# Patient Record
Sex: Female | Born: 2000 | Race: Black or African American | Hispanic: No | Marital: Single | Smoking: Never smoker
Health system: Southern US, Community
[De-identification: ages and names within clinical notes are randomized; demographics above are authoritative.]

## PROBLEM LIST (undated history)

## (undated) DIAGNOSIS — Q220 Pulmonary valve atresia: Secondary | ICD-10-CM

## (undated) DIAGNOSIS — J45909 Unspecified asthma, uncomplicated: Secondary | ICD-10-CM

## (undated) HISTORY — PX: CARDIAC SURGERY: SHX584

---

## 2020-07-27 ENCOUNTER — Emergency Department (HOSPITAL_COMMUNITY)
Admission: EM | Admit: 2020-07-27 | Discharge: 2020-07-27 | Disposition: A | Discharge status: Home / Self Care | Payer: Self-pay | Attending: Emergency Medicine | Admitting: Emergency Medicine

## 2020-07-27 ENCOUNTER — Encounter (HOSPITAL_COMMUNITY): Payer: Self-pay | Admitting: Emergency Medicine

## 2020-07-27 ENCOUNTER — Other Ambulatory Visit: Payer: Self-pay

## 2020-07-27 DIAGNOSIS — R1084 Generalized abdominal pain: Secondary | ICD-10-CM | POA: Insufficient documentation

## 2020-07-27 DIAGNOSIS — B9689 Other specified bacterial agents as the cause of diseases classified elsewhere: Secondary | ICD-10-CM | POA: Insufficient documentation

## 2020-07-27 DIAGNOSIS — R82998 Other abnormal findings in urine: Secondary | ICD-10-CM | POA: Insufficient documentation

## 2020-07-27 DIAGNOSIS — R944 Abnormal results of kidney function studies: Secondary | ICD-10-CM | POA: Insufficient documentation

## 2020-07-27 DIAGNOSIS — R112 Nausea with vomiting, unspecified: Secondary | ICD-10-CM | POA: Insufficient documentation

## 2020-07-27 HISTORY — DX: Pulmonary valve atresia: Q22.0

## 2020-07-27 LAB — URINALYSIS, ROUTINE W REFLEX MICROSCOPIC
Bilirubin Urine: NEGATIVE
Glucose, UA: NEGATIVE mg/dL
Hgb urine dipstick: NEGATIVE
Ketones, ur: NEGATIVE mg/dL
Leukocytes,Ua: NEGATIVE
Nitrite: POSITIVE — AB
Specific Gravity, Urine: 1.02 (ref 1.005–1.030)
pH: 8.5 — ABNORMAL HIGH (ref 5.0–8.0)

## 2020-07-27 LAB — COMPREHENSIVE METABOLIC PANEL
ALT: 22 U/L (ref 0–44)
AST: 38 U/L (ref 15–41)
Albumin: 5 g/dL (ref 3.5–5.0)
Alkaline Phosphatase: 77 U/L (ref 38–126)
Anion gap: 11 (ref 5–15)
BUN: 15 mg/dL (ref 6–20)
CO2: 20 mmol/L — ABNORMAL LOW (ref 22–32)
Calcium: 10 mg/dL (ref 8.9–10.3)
Chloride: 106 mmol/L (ref 98–111)
Creatinine, Ser: 1.01 mg/dL — ABNORMAL HIGH (ref 0.44–1.00)
GFR, Estimated: >60 mL/min (ref >60)
Glucose, Bld: 119 mg/dL — ABNORMAL HIGH (ref 70–99)
Potassium: 4.5 mmol/L (ref 3.5–5.1)
Sodium: 137 mmol/L (ref 135–145)
Total Bilirubin: 1.5 mg/dL — ABNORMAL HIGH (ref 0.3–1.2)
Total Protein: 8.3 g/dL — ABNORMAL HIGH (ref 6.5–8.1)

## 2020-07-27 LAB — CBC WITH DIFFERENTIAL/PLATELET
Abs Immature Granulocytes: 0.02 10*3/uL (ref 0.00–0.07)
Basophils Absolute: 0 10*3/uL (ref 0.0–0.1)
Basophils Relative: 0 %
Eosinophils Absolute: 0 10*3/uL (ref 0.0–0.5)
Eosinophils Relative: 0 %
HCT: 52.3 % — ABNORMAL HIGH (ref 36.0–46.0)
Hemoglobin: 16.9 g/dL — ABNORMAL HIGH (ref 12.0–15.0)
Immature Granulocytes: 0 %
Lymphocytes Relative: 17 %
Lymphs Abs: 1.2 10*3/uL (ref 0.7–4.0)
MCH: 28.6 pg (ref 26.0–34.0)
MCHC: 32.3 g/dL (ref 30.0–36.0)
MCV: 88.5 fL (ref 80.0–100.0)
Monocytes Absolute: 0.6 10*3/uL (ref 0.1–1.0)
Monocytes Relative: 9 %
Neutro Abs: 5 10*3/uL (ref 1.7–7.7)
Neutrophils Relative %: 74 %
Platelets: 202 10*3/uL (ref 150–400)
RBC: 5.91 MIL/uL — ABNORMAL HIGH (ref 3.87–5.11)
RDW: 13.3 % (ref 11.5–15.5)
WBC: 6.8 10*3/uL (ref 4.0–10.5)
nRBC: 0 % (ref 0.0–0.2)

## 2020-07-27 LAB — URINALYSIS, MICROSCOPIC (REFLEX): RBC / HPF: NONE SEEN RBC/hpf (ref 0–5)

## 2020-07-27 LAB — POC URINE PREG, ED: Preg Test, Ur: NEGATIVE

## 2020-07-27 LAB — PREGNANCY, URINE: Preg Test, Ur: NEGATIVE

## 2020-07-27 LAB — LIPASE, BLOOD: Lipase: 23 U/L (ref 11–51)

## 2020-07-27 MED ORDER — SUCRALFATE 1 G PO TABS: 1.0000 g | ORAL_TABLET | Freq: Three times a day (TID) | ORAL | 0 refills | Status: DC

## 2020-07-27 MED ORDER — PANTOPRAZOLE SODIUM 20 MG PO TBEC: 20.0000 mg | DELAYED_RELEASE_TABLET | Freq: Every day | ORAL | 0 refills | Status: DC

## 2020-07-27 MED ORDER — SUCRALFATE 1 G PO TABS
1.0000 g | ORAL_TABLET | Freq: Once | ORAL | Status: AC
  Administered 2020-07-27: 1 g via ORAL
  Filled 2020-07-27: qty 1

## 2020-07-27 MED ORDER — FAMOTIDINE IN NACL 20-0.9 MG/50ML-% IV SOLN
20.0000 mg | Freq: Once | INTRAVENOUS | Status: AC
  Administered 2020-07-27: 20 mg via INTRAVENOUS
  Filled 2020-07-27: qty 50

## 2020-07-27 MED ORDER — SODIUM CHLORIDE 0.9 % IV BOLUS
1000.0000 mL | Freq: Once | INTRAVENOUS | Status: AC
  Administered 2020-07-27: 1000 mL via INTRAVENOUS

## 2020-07-27 MED ORDER — FENTANYL CITRATE (PF) 100 MCG/2ML IJ SOLN
100.0000 ug | Freq: Once | INTRAMUSCULAR | Status: AC
Start: 2020-07-27 — End: 2020-07-27
  Administered 2020-07-27: 100 ug via INTRAVENOUS
  Filled 2020-07-27: qty 2

## 2020-07-27 MED ORDER — ONDANSETRON HCL 4 MG/2ML IJ SOLN
4.0000 mg | Freq: Once | INTRAMUSCULAR | Status: AC
  Administered 2020-07-27: 4 mg via INTRAVENOUS
  Filled 2020-07-27: qty 2

## 2020-07-27 NOTE — Discharge Instructions (Addendum)
You came to the [facility] today to be evaluated for your abdominal pain, nausea, and vomiting.  Your physical exam and lab work were reassuring.  I did give you prescription for Pantoprazole and Carafate.  Please take these medications as prescribed.  I have given you permission to follow-up with a local gastroenterologist.  Please call their office tomorrow to schedule an appointment.  Please also refrain from smoking any marijuana as this can cause nausea, vomiting, and abdominal pain.  Get help right away if: Your pain does not go away as soon as your health care provider told you to expect. You cannot stop vomiting. Your pain is only in areas of the abdomen, such as the right side or the left lower portion of the abdomen. Pain on the right side could be caused by appendicitis. You have bloody or black stools, or stools that look like tar. You have severe pain, cramping, or bloating in your abdomen. You have signs of dehydration, such as: Dark urine, very little urine, or no urine. Cracked lips. Dry mouth. Sunken eyes. Sleepiness. Weakness. You have trouble breathing or chest pain.

## 2020-07-27 NOTE — ED Triage Notes (Addendum)
Patient c/o abdominal pain today with N/V/D. Reports hx of ulcer and tapeworm. [place]. Patient acitvely vomiting in triage and removing blood pressure cuff prior to completion of blood pressure measurement.

## 2020-07-27 NOTE — ED Provider Notes (Signed)
[facility]-EMERGENCY DEPT Provider Note   CSN: [id number] Arrival date & time: 07/27/20  1717     History Chief Complaint  Patient presents with  . Abdominal Pain    Maureen Schultz is a 20 y.o. 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 pylori.  Reports that she was treated for H. pylori last month by a gastroenterologist in [place].  Patient has just recently moved to the area.  She does not take any medications on a regular basis.  Presents with chief complaint of upper quadrant abdominal pain.  Patient reports that paint started 0800 this morning.  Pain has been constant since then and progressively worsening.  Pain radiates throughout her entire abdomen.  Patient rates pain 10/10 on the pain scale.  Patient endorses associated nausea and vomiting.  Patient endorses vomiting more than 20 times since this morning.  Patient describes emesis as bilious.  Denies any coffee-ground emesis or bloody emesis.  Patient endorses diarrhea.  She tried Zofran and Pepcid at home with no relief of his symptoms.  Patient denies any fevers, chills, abdominal distention, blood in stool, melena, urinary symptoms, vaginal bleeding, vaginal pain, vaginal discharge.  LMP4/30.  Patient is sexually active in a mutually monogamous relationship with female partner.  Patient denies any birth control or condom use.  Patient denies ibuprofen use, and alcohol use.  Patient endorses marijuana use.  HPI     Past Medical History:  Diagnosis Date  . Pulmonary atresia     There are no problems to display for this patient.   History reviewed. No pertinent surgical history.   OB History   No obstetric history on file.     No family history on file.     Home Medications Prior to Admission medications   Not on File    Allergies    Patient has no known allergies.  Review of Systems   Review of Systems  Constitutional: Negative for chills and fever.   Eyes: Negative for visual disturbance.  Respiratory: Negative for shortness of breath.   Cardiovascular: Negative for chest pain.  Gastrointestinal: Positive for abdominal pain, diarrhea, nausea and vomiting. Negative for abdominal distention, anal bleeding, blood in stool, constipation and rectal pain.  Genitourinary: Negative for decreased urine volume, difficulty urinating, dysuria, flank pain, frequency, hematuria, pelvic pain, vaginal bleeding, vaginal discharge and vaginal pain.  Musculoskeletal: Negative for back pain and neck pain.  Skin: Negative for color change and rash.  Neurological: Negative for dizziness, syncope, light-headedness and headaches.  Psychiatric/Behavioral: Negative for confusion.    Physical Exam Updated Vital Signs BP 122/85 (BP Location: Left Arm)   Pulse 64   Resp (!) 24   LMP 07/23/2020   SpO2 93%   Physical Exam Vitals and nursing note reviewed.  Constitutional:      General: She is not in acute distress.    Appearance: She is not ill-appearing, toxic-appearing or diaphoretic.  HENT:     Head: Normocephalic.  Eyes:     General: No scleral icterus.       Right eye: No discharge.        Left eye: No discharge.  Cardiovascular:     Rate and Rhythm: Normal rate.  Pulmonary:     Effort: Pulmonary effort is normal. No respiratory distress.     Breath sounds: Normal breath sounds.  Abdominal:     General: Abdomen is flat. Bowel sounds are normal. There is no distension. There are no signs  of injury.     Palpations: Abdomen is soft. There is no mass or pulsatile mass.     Tenderness: There is generalized abdominal tenderness. There is no right CVA tenderness, left CVA tenderness, guarding or rebound.     Hernia: There is no hernia in the umbilical area or ventral area.     Comments: Piercing to umbilicus  Musculoskeletal:     Cervical back: Normal range of motion and neck supple.     Right lower leg: No swelling or tenderness. No edema.     Left  lower leg: No swelling or tenderness. No edema.  Skin:    General: Skin is warm and dry.     Coloration: Skin is not cyanotic, jaundiced or pale.  Neurological:     General: No focal deficit present.     Mental Status: She is alert.  Psychiatric:        Behavior: Behavior is cooperative.     ED Results / Procedures / Treatments   Labs (all labs ordered are listed, but only abnormal results are displayed) Labs Reviewed  COMPREHENSIVE METABOLIC PANEL - Abnormal; Notable for the following components:      Result Value   CO2 20 (*)    Glucose, Bld 119 (*)    Creatinine, Ser 1.01 (*)    Total Protein 8.3 (*)    Total Bilirubin 1.5 (*)    All other components within normal limits  URINALYSIS, ROUTINE W REFLEX MICROSCOPIC - Abnormal; Notable for the following components:   APPearance HAZY (*)    pH 8.5 (*)    Protein, ur TRACE (*)    Nitrite POSITIVE (*)    All other components within normal limits  CBC WITH DIFFERENTIAL/PLATELET - Abnormal; Notable for the following components:   RBC 5.91 (*)    Hemoglobin 16.9 (*)    HCT 52.3 (*)    All other components within normal limits  URINALYSIS, MICROSCOPIC (REFLEX) - Abnormal; Notable for the following components:   Bacteria, UA MANY (*)    All other components within normal limits  URINE CULTURE  LIPASE, BLOOD  PREGNANCY, URINE  I-STAT BETA HCG BLOOD, ED ([facility], [facility], [facility] ONLY)  POC URINE PREG, ED    EKG None  Radiology No results found.  Procedures Procedures   Medications Ordered in ED Medications  fentaNYL (SUBLIMAZE) injection 100 mcg (100 mcg Intravenous Given 07/27/20 1757)  ondansetron (ZOFRAN) injection 4 mg (4 mg Intravenous Given 07/27/20 1757)  sodium chloride 0.9 % bolus 1,000 mL (0 mLs Intravenous Stopped 07/27/20 1854)  famotidine (PEPCID) IVPB 20 mg premix (0 mg Intravenous Stopped 07/27/20 1959)  sucralfate (CARAFATE) tablet 1 g (1 g Oral Given 07/27/20 2235)    ED Course  I have reviewed the triage vital  signs and the nursing notes.  Pertinent labs & imaging results that were available during my care of the patient were reviewed by me and considered in my medical decision making (see chart for details).    MDM Rules/Calculators/A&P                          Upon initial assessment patient is actively dry heaving and writhing on stretcher.  Abdomen is soft, nondistended, generalized tenderness throughout abdomen.  No mass, pulsatile mass, guarding, rebound tenderness, or CVA tenderness.  Hemodynamically stable.  Will give patient IV Zofran and fentanyl.  Will reassess and obtain more history of present illness.  On repeat examination patient is  in no acute distress, no longer dry heaving.  Abdomen is soft, nondistended, nontender.  Will obtain pregnancy test, CBC, CMP, lipase, urinalysis.  Patient given 1 L fluid bolus and Pepcid.  CBC shows hemoconcentration likely secondary to dehydration from nausea and vomiting. CMP shows creatinine slightly elevated at 1.01, total bili slightly elevated at 1.5, and bicarb slightly decreased at 20. Lipase within normal limits, low suspicion for pancreatitis.  Patient is afebrile, no leukocytosis, no tenderness to right lower quadrant or rebound tenderness present.  Low suspicion for acute appendicitis.  Pregnancy test negative, low suspicion for intrauterine pregnancy or ectopic pregnancy.  Urinalysis shows leukocytes negative, nitrite positive, bacteria many, squamous epithelial 6-10, WBC 6-10, RBC none seen.  We will send urine for culture at this time.  Will defer any treatment as patient is asymptomatic and nonpregnant.  On serial repeat examination patient's abdomen remains soft, nondistended, nontender.  Patient has no further episodes of vomiting.  Patient able to tolerate p.o. challenge without difficulty.  Patient reports improvement in symptoms and feels ready for discharge.  Will discharge patient with prescription for pantoprazole and Carafate.   Patient reports that she has Zofran medication at home.  We will give patient information to follow-up with [facility] gastroenterology.Discussed results, findings, treatment and follow up. Patient advised of return precautions. Patient verbalized understanding and agreed with plan.   Final Clinical Impression(s) / ED Diagnoses Final diagnoses:  Generalized abdominal pain    Rx / DC Orders ED Discharge Orders         Ordered    pantoprazole (PROTONIX) 20 MG tablet  Daily        07/27/20 2216    sucralfate (CARAFATE) 1 g tablet  3 times daily with meals & bedtime        07/27/20 2216           Haskel Schroeder, PA-C 07/28/20 0044    Lorre Nick, MD 07/30/20 [phone]

## 2020-07-27 NOTE — ED Notes (Signed)
Call to lab to find out ETA of UA which was sent at 2015

## 2020-07-27 NOTE — ED Notes (Signed)
Call to lab to add culture on to urine in lab

## 2020-07-27 NOTE — ED Notes (Signed)
Pt appears in no distress.  Water and ginger ale given

## 2020-07-29 ENCOUNTER — Encounter (HOSPITAL_COMMUNITY): Payer: Self-pay

## 2020-07-29 ENCOUNTER — Other Ambulatory Visit: Payer: Self-pay

## 2020-07-29 ENCOUNTER — Emergency Department (HOSPITAL_COMMUNITY)
Admission: EM | Admit: 2020-07-29 | Discharge: 2020-07-29 | Disposition: A | Discharge status: Home / Self Care | Payer: Self-pay | Attending: Emergency Medicine | Admitting: Emergency Medicine

## 2020-07-29 DIAGNOSIS — R109 Unspecified abdominal pain: Secondary | ICD-10-CM

## 2020-07-29 DIAGNOSIS — R112 Nausea with vomiting, unspecified: Secondary | ICD-10-CM | POA: Insufficient documentation

## 2020-07-29 DIAGNOSIS — R197 Diarrhea, unspecified: Secondary | ICD-10-CM | POA: Insufficient documentation

## 2020-07-29 DIAGNOSIS — J45909 Unspecified asthma, uncomplicated: Secondary | ICD-10-CM | POA: Insufficient documentation

## 2020-07-29 DIAGNOSIS — R1084 Generalized abdominal pain: Secondary | ICD-10-CM | POA: Insufficient documentation

## 2020-07-29 HISTORY — DX: Unspecified asthma, uncomplicated: J45.909

## 2020-07-29 LAB — COMPREHENSIVE METABOLIC PANEL
ALT: 19 U/L (ref 0–44)
AST: 25 U/L (ref 15–41)
Albumin: 4.2 g/dL (ref 3.5–5.0)
Alkaline Phosphatase: 66 U/L (ref 38–126)
Anion gap: 8 (ref 5–15)
BUN: 13 mg/dL (ref 6–20)
CO2: 21 mmol/L — ABNORMAL LOW (ref 22–32)
Calcium: 9 mg/dL (ref 8.9–10.3)
Chloride: 107 mmol/L (ref 98–111)
Creatinine, Ser: 0.76 mg/dL (ref 0.44–1.00)
GFR, Estimated: >60 mL/min (ref >60)
Glucose, Bld: 82 mg/dL (ref 70–99)
Potassium: 3.8 mmol/L (ref 3.5–5.1)
Sodium: 136 mmol/L (ref 135–145)
Total Bilirubin: 1.2 mg/dL (ref 0.3–1.2)
Total Protein: 7 g/dL (ref 6.5–8.1)

## 2020-07-29 LAB — CBC
HCT: 46.3 % — ABNORMAL HIGH (ref 36.0–46.0)
Hemoglobin: 15.5 g/dL — ABNORMAL HIGH (ref 12.0–15.0)
MCH: 29.1 pg (ref 26.0–34.0)
MCHC: 33.5 g/dL (ref 30.0–36.0)
MCV: 86.9 fL (ref 80.0–100.0)
Platelets: 164 10*3/uL (ref 150–400)
RBC: 5.33 MIL/uL — ABNORMAL HIGH (ref 3.87–5.11)
RDW: 13.2 % (ref 11.5–15.5)
WBC: 4.4 10*3/uL (ref 4.0–10.5)
nRBC: 0 % (ref 0.0–0.2)

## 2020-07-29 LAB — LIPASE, BLOOD: Lipase: 23 U/L (ref 11–51)

## 2020-07-29 LAB — RAPID URINE DRUG SCREEN, HOSP PERFORMED
Amphetamines: NOT DETECTED
Barbiturates: NOT DETECTED
Benzodiazepines: NOT DETECTED
Cocaine: NOT DETECTED
Opiates: NOT DETECTED
Tetrahydrocannabinol: POSITIVE — AB

## 2020-07-29 LAB — URINALYSIS, ROUTINE W REFLEX MICROSCOPIC
Bilirubin Urine: NEGATIVE
Glucose, UA: NEGATIVE mg/dL
Hgb urine dipstick: NEGATIVE
Ketones, ur: 20 mg/dL — AB
Leukocytes,Ua: NEGATIVE
Nitrite: NEGATIVE
Protein, ur: NEGATIVE mg/dL
Specific Gravity, Urine: 1.006 (ref 1.005–1.030)
pH: 7 (ref 5.0–8.0)

## 2020-07-29 LAB — I-STAT BETA HCG BLOOD, ED (MC, WL, AP ONLY): I-stat hCG, quantitative: <5 m[IU]/mL (ref <5)

## 2020-07-29 MED ORDER — ALUM & MAG HYDROXIDE-SIMETH 200-200-20 MG/5ML PO SUSP
30.0000 mL | Freq: Once | ORAL | Status: AC
  Administered 2020-07-29: 30 mL via ORAL
  Filled 2020-07-29: qty 30

## 2020-07-29 MED ORDER — ONDANSETRON 4 MG PO TBDP: 4.0000 mg | ORAL_TABLET | Freq: Three times a day (TID) | ORAL | 0 refills | Status: DC | PRN

## 2020-07-29 MED ORDER — LIDOCAINE VISCOUS HCL 2 % MT SOLN
15.0000 mL | Freq: Once | OROMUCOSAL | Status: AC
  Administered 2020-07-29: 15 mL via ORAL
  Filled 2020-07-29: qty 15

## 2020-07-29 MED ORDER — ONDANSETRON HCL 4 MG/2ML IJ SOLN
4.0000 mg | Freq: Once | INTRAMUSCULAR | Status: AC
  Administered 2020-07-29: 4 mg via INTRAVENOUS
  Filled 2020-07-29: qty 2

## 2020-07-29 MED ORDER — PANTOPRAZOLE SODIUM 40 MG IV SOLR
40.0000 mg | Freq: Once | INTRAVENOUS | Status: AC
  Administered 2020-07-29: 40 mg via INTRAVENOUS
  Filled 2020-07-29: qty 40

## 2020-07-29 MED ORDER — LACTATED RINGERS IV BOLUS
1000.0000 mL | Freq: Once | INTRAVENOUS | Status: AC
  Administered 2020-07-29: 1000 mL via INTRAVENOUS

## 2020-07-29 NOTE — ED Triage Notes (Signed)
Patient c/o generalized abdominal pain, N/V/D x 1 hour.  Patient states she was seen 2 daysa go for the same symptoms.

## 2020-07-29 NOTE — Discharge Instructions (Signed)
Take the Zofran prescribed for nausea.  You can use over-the-counter Pepto-Bismol or Maalox for irritation as well as the proton pump inhibitor.  Continue to take all prescribed medications and follow-up with gastroenterologist provided.  Return to [place] with any concerning changes.  Avoid any irritating substances that we discussed

## 2020-07-29 NOTE — ED Provider Notes (Signed)
[facility]-EMERGENCY DEPT Provider Note   CSN: [id number] Arrival date & time: 07/29/20  1251     History Chief Complaint  Patient presents with  . Abdominal Pain  . Emesis  . Diarrhea    Maureen Schultz is a 20 y.o. female.   Abdominal Pain Pain location:  Generalized Pain quality: aching   Pain radiates to:  Does not radiate Pain severity:  Moderate Onset quality:  Gradual Timing:  Intermittent Progression:  Waxing and waning Chronicity:  Recurrent Context comment:  HX of PUD Relieved by: PPI. Worsened by:  Nothing Ineffective treatments:  None tried Associated symptoms: diarrhea, nausea and vomiting   Associated symptoms: no chest pain, no chills, no cough, no dysuria, no fever and no shortness of breath        Past Medical History:  Diagnosis Date  . Asthma   . Pulmonary atresia     There are no problems to display for this patient.   Past Surgical History:  Procedure Laterality Date  . CARDIAC SURGERY     x3     OB History   No obstetric history on file.     Family History  Family history unknown: Yes    Social History   Tobacco Use  . Smoking status: Never Smoker  . Smokeless tobacco: Never Used  Vaping Use  . Vaping Use: Never used  Substance Use Topics  . Alcohol use: Never  . Drug use: Yes    Types: Marijuana    Home Medications Prior to Admission medications   Medication Sig Start Date End Date Taking? Authorizing Provider  ondansetron (ZOFRAN ODT) 4 MG disintegrating tablet Take 1 tablet (4 mg total) by mouth every 8 (eight) hours as needed for up to 10 doses for nausea or vomiting. 07/29/20  Yes Sabino Donovan, MD  pantoprazole (PROTONIX) 20 MG tablet Take 1 tablet (20 mg total) by mouth daily. 07/27/20   Haskel Schroeder, PA-C  sucralfate (CARAFATE) 1 g tablet Take 1 tablet (1 g total) by mouth 4 (four) times daily -  with meals and at bedtime. 07/27/20 08/26/20  Haskel Schroeder, PA-C    Allergies     Other, Pineapple, and Raspberry  Review of Systems   Review of Systems  Constitutional: Negative for chills and fever.  HENT: Negative for congestion and rhinorrhea.   Respiratory: Negative for cough and shortness of breath.   Cardiovascular: Negative for chest pain and palpitations.  Gastrointestinal: Positive for abdominal pain, diarrhea, nausea and vomiting.  Genitourinary: Negative for difficulty urinating and dysuria.  Musculoskeletal: Negative for arthralgias and back pain.  Skin: Negative for rash and wound.  Neurological: Negative for light-headedness and headaches.    Physical Exam Updated Vital Signs BP 110/68   Pulse (!) 52   Temp (!) 97.4 F (36.3 C) (Oral)   Resp 18   Ht 5\' 3"  (1.6 m)   Wt 74.8 kg   LMP 07/23/2020   SpO2 92%   BMI 29.23 kg/m   Physical Exam Vitals and nursing note reviewed. Exam conducted with a chaperone present.  Constitutional:      General: She is not in acute distress.    Appearance: Normal appearance.  HENT:     Head: Normocephalic and atraumatic.     Nose: No rhinorrhea.  Eyes:     General:        Right eye: No discharge.        Left eye: No discharge.  Conjunctiva/sclera: Conjunctivae normal.  Cardiovascular:     Rate and Rhythm: Normal rate and regular rhythm.  Pulmonary:     Effort: Pulmonary effort is normal. No respiratory distress.     Breath sounds: No stridor.  Abdominal:     General: Abdomen is flat. There is no distension.     Palpations: Abdomen is soft.     Tenderness: There is generalized abdominal tenderness. There is no guarding or rebound. Negative signs include Murphy's sign, Rovsing's sign and McBurney's sign.  Musculoskeletal:        General: No tenderness or signs of injury.  Skin:    General: Skin is warm and dry.  Neurological:     General: No focal deficit present.     Mental Status: She is alert. Mental status is at baseline.     Motor: No weakness.  Psychiatric:        Mood and Affect: Mood  normal.        Behavior: Behavior normal.     ED Results / Procedures / Treatments   Labs (all labs ordered are listed, but only abnormal results are displayed) Labs Reviewed  COMPREHENSIVE METABOLIC PANEL - Abnormal; Notable for the following components:      Result Value   CO2 21 (*)    All other components within normal limits  CBC - Abnormal; Notable for the following components:   RBC 5.33 (*)    Hemoglobin 15.5 (*)    HCT 46.3 (*)    All other components within normal limits  URINALYSIS, ROUTINE W REFLEX MICROSCOPIC - Abnormal; Notable for the following components:   Color, Urine STRAW (*)    Ketones, ur 20 (*)    All other components within normal limits  RAPID URINE DRUG SCREEN, HOSP PERFORMED - Abnormal; Notable for the following components:   Tetrahydrocannabinol POSITIVE (*)    All other components within normal limits  LIPASE, BLOOD  I-STAT BETA HCG BLOOD, ED ([facility], [facility], [facility] ONLY)    EKG None  Radiology No results found.  Procedures Procedures   Medications Ordered in ED Medications  alum & mag hydroxide-simeth (MAALOX/MYLANTA) 200-200-20 MG/5ML suspension 30 mL (30 mLs Oral Given 07/29/20 1352)    And  lidocaine (XYLOCAINE) 2 % viscous mouth solution 15 mL (15 mLs Oral Given 07/29/20 1353)  ondansetron (ZOFRAN) injection 4 mg (4 mg Intravenous Given 07/29/20 1353)  lactated ringers bolus 1,000 mL (1,000 mLs Intravenous New Bag/Given 07/29/20 1353)  pantoprazole (PROTONIX) injection 40 mg (40 mg Intravenous Given 07/29/20 1443)    ED Course  I have reviewed the triage vital signs and the nursing notes.  Pertinent labs & imaging results that were available during my care of the patient were reviewed by me and considered in my medical decision making (see chart for details).    MDM Rules/Calculators/A&P                          Generalized abdominal pain history of peptic ulcer disease.  Feels that this is very similar.  Was seen a few days ago.  Had IV  fluids and PPI.  At that time was feeling much better.  Has gone home and had a good day with no symptoms yesterday and now has symptoms again nausea vomiting diarrhea.  Exam is nonfocal no signs of peritonitis she is overall well-appearing well-hydrated.  She will get IV fluids IV antiemetics GI cocktail viscous lidocaine Maalox and screening laboratory studies.  Laboratory studies are unremarkable except for THC.  This could be the cause of her nausea vomiting.  He may also have peptic ulcer disease as she claims.  She needs outpatient follow-up with gastroenterology this is provided.  After symptom control viscous lidocaine Maalox and PPI she is feeling much better.  She will be discharged home return precautions discussed.  Final Clinical Impression(s) / ED Diagnoses Final diagnoses:  Undifferentiated abdominal pain    Rx / DC Orders ED Discharge Orders         Ordered    ondansetron (ZOFRAN ODT) 4 MG disintegrating tablet  Every 8 hours PRN        07/29/20 1524           Sabino Donovan, MD 07/29/20 1526

## 2020-07-30 LAB — URINE CULTURE: Culture: >=100000 — AB

## 2020-07-31 ENCOUNTER — Telehealth: Payer: Self-pay | Admitting: Emergency Medicine

## 2020-07-31 NOTE — Progress Notes (Signed)
ED Antimicrobial Stewardship Positive Culture Follow Up   Maureen Schultz is an 20 y.o. female who presented to [facility] on 07/27/2020 with a chief complaint of  Chief Complaint  Patient presents with  . Abdominal Pain    Recent Results (from the past 720 hour(s))  Urine culture     Status: Abnormal   Collection Time: 07/27/20 10:13 PM   Specimen: Urine, Random  Result Value Ref Range Status   Specimen Description   Final    URINE, RANDOM Performed at [facility], [address] [place], [place] [address]    Special Requests   Final    NONE Performed at [facility], [address], [place], [place] [address]    Culture >=100,000 COLONIES/mL ESCHERICHIA COLI (A)  Final   Report Status 07/30/2020 FINAL  Final   Organism ID, Bacteria ESCHERICHIA COLI (A)  Final      Susceptibility   Escherichia coli - MIC*    AMPICILLIN >=32 RESISTANT Resistant     CEFAZOLIN <=4 SENSITIVE Sensitive     CEFEPIME <=0.12 SENSITIVE Sensitive     CEFTRIAXONE <=0.25 SENSITIVE Sensitive     CIPROFLOXACIN <=0.25 SENSITIVE Sensitive     GENTAMICIN <=1 SENSITIVE Sensitive     IMIPENEM <=0.25 SENSITIVE Sensitive     NITROFURANTOIN <=16 SENSITIVE Sensitive     TRIMETH/SULFA >=320 RESISTANT Resistant     AMPICILLIN/SULBACTAM >=32 RESISTANT Resistant     PIP/TAZO <=4 SENSITIVE Sensitive     * >=100,000 COLONIES/mL ESCHERICHIA COLI   Treatment plan is okay for now. Will call pt and recheck for urinary sx, if positive, will give Keflex 500mg  PO BID x 7 days.  ED Provider: , PA-C   Sharen Heck 07/31/2020, 10:14 AM Student Pharmacist

## 2020-07-31 NOTE — Telephone Encounter (Signed)
Post ED Visit - Positive Culture Follow-up: Successful Patient Follow-Up  Culture assessed and recommendations reviewed by:  []  , Pharm.D. []  Enzo Bi, Pharm.D., BCPS AQ-ID []  , Pharm.D., BCPS []  Celedonio Miyamoto, Pharm.D., BCPS []  [place], Garvin Fila.D., BCPS, AAHIVP []  , Pharm.D., BCPS, AAHIVP []  Georgina Pillion, PharmD, BCPS []  , PharmD, BCPS []  [place], PharmD, BCPS []  [place], PharmD  Positive urine culture  []  Patient discharged without antimicrobial prescription and treatment is now indicated []  Organism is resistant to prescribed ED discharge antimicrobial []  Patient with positive blood cultures  Changes discussed with ED provider: PA  New antibiotic prescription symptom check, if sx start keflex 500mg  po bid x 7 days  Contacted patient, no urinary symptoms, therefore no treatment needed   Estella Husk 07/31/2020, 11:16 AM

## 2020-12-18 ENCOUNTER — Emergency Department (HOSPITAL_COMMUNITY): Payer: Commercial Managed Care - PPO

## 2020-12-18 ENCOUNTER — Encounter (HOSPITAL_COMMUNITY): Payer: Self-pay | Admitting: *Deleted

## 2020-12-18 ENCOUNTER — Emergency Department (HOSPITAL_COMMUNITY)
Admission: EM | Admit: 2020-12-18 | Discharge: 2020-12-18 | Disposition: A | Discharge status: Home / Self Care | Payer: Commercial Managed Care - PPO | Attending: Emergency Medicine | Admitting: Emergency Medicine

## 2020-12-18 DIAGNOSIS — R112 Nausea with vomiting, unspecified: Secondary | ICD-10-CM | POA: Insufficient documentation

## 2020-12-18 DIAGNOSIS — R109 Unspecified abdominal pain: Secondary | ICD-10-CM | POA: Diagnosis present

## 2020-12-18 DIAGNOSIS — R1084 Generalized abdominal pain: Secondary | ICD-10-CM | POA: Diagnosis not present

## 2020-12-18 DIAGNOSIS — J45909 Unspecified asthma, uncomplicated: Secondary | ICD-10-CM | POA: Insufficient documentation

## 2020-12-18 DIAGNOSIS — R197 Diarrhea, unspecified: Secondary | ICD-10-CM | POA: Diagnosis not present

## 2020-12-18 LAB — COMPREHENSIVE METABOLIC PANEL
ALT: 25 U/L (ref 0–44)
AST: 38 U/L (ref 15–41)
Albumin: 4.4 g/dL (ref 3.5–5.0)
Alkaline Phosphatase: 67 U/L (ref 38–126)
Anion gap: 10 (ref 5–15)
BUN: 11 mg/dL (ref 6–20)
CO2: 18 mmol/L — ABNORMAL LOW (ref 22–32)
Calcium: 9.7 mg/dL (ref 8.9–10.3)
Chloride: 110 mmol/L (ref 98–111)
Creatinine, Ser: 0.91 mg/dL (ref 0.44–1.00)
GFR, Estimated: >60 mL/min (ref >60)
Glucose, Bld: 111 mg/dL — ABNORMAL HIGH (ref 70–99)
Potassium: 4.5 mmol/L (ref 3.5–5.1)
Sodium: 138 mmol/L (ref 135–145)
Total Bilirubin: 1.3 mg/dL — ABNORMAL HIGH (ref 0.3–1.2)
Total Protein: 7.4 g/dL (ref 6.5–8.1)

## 2020-12-18 LAB — CBC WITH DIFFERENTIAL/PLATELET
Abs Immature Granulocytes: 0.01 10*3/uL (ref 0.00–0.07)
Basophils Absolute: 0 10*3/uL (ref 0.0–0.1)
Basophils Relative: 0 %
Eosinophils Absolute: 0 10*3/uL (ref 0.0–0.5)
Eosinophils Relative: 0 %
HCT: 47.4 % — ABNORMAL HIGH (ref 36.0–46.0)
Hemoglobin: 15.9 g/dL — ABNORMAL HIGH (ref 12.0–15.0)
Immature Granulocytes: 0 %
Lymphocytes Relative: 12 %
Lymphs Abs: 0.6 10*3/uL — ABNORMAL LOW (ref 0.7–4.0)
MCH: 29.4 pg (ref 26.0–34.0)
MCHC: 33.5 g/dL (ref 30.0–36.0)
MCV: 87.6 fL (ref 80.0–100.0)
Monocytes Absolute: 0.4 10*3/uL (ref 0.1–1.0)
Monocytes Relative: 7 %
Neutro Abs: 4.1 10*3/uL (ref 1.7–7.7)
Neutrophils Relative %: 81 %
Platelets: 180 10*3/uL (ref 150–400)
RBC: 5.41 MIL/uL — ABNORMAL HIGH (ref 3.87–5.11)
RDW: 13.5 % (ref 11.5–15.5)
WBC: 5.1 10*3/uL (ref 4.0–10.5)
nRBC: 0 % (ref 0.0–0.2)

## 2020-12-18 LAB — URINALYSIS, ROUTINE W REFLEX MICROSCOPIC
Bilirubin Urine: NEGATIVE
Glucose, UA: NEGATIVE mg/dL
Ketones, ur: 20 mg/dL — AB
Leukocytes,Ua: NEGATIVE
Nitrite: NEGATIVE
Protein, ur: 30 mg/dL — AB
Specific Gravity, Urine: 1.017 (ref 1.005–1.030)
pH: 8 (ref 5.0–8.0)

## 2020-12-18 LAB — LIPASE, BLOOD: Lipase: 26 U/L (ref 11–51)

## 2020-12-18 LAB — POC URINE PREG, ED: Preg Test, Ur: NEGATIVE

## 2020-12-18 LAB — I-STAT BETA HCG BLOOD, ED (MC, WL, AP ONLY): I-stat hCG, quantitative: <5 m[IU]/mL (ref <5)

## 2020-12-18 MED ORDER — ONDANSETRON 4 MG PO TBDP: 4.0000 mg | ORAL_TABLET | Freq: Three times a day (TID) | ORAL | 0 refills | Status: DC | PRN

## 2020-12-18 MED ORDER — SODIUM CHLORIDE 0.9 % IV BOLUS
1000.0000 mL | Freq: Once | INTRAVENOUS | Status: AC
  Administered 2020-12-18: 1000 mL via INTRAVENOUS

## 2020-12-18 MED ORDER — ONDANSETRON HCL 4 MG/2ML IJ SOLN
4.0000 mg | Freq: Once | INTRAMUSCULAR | Status: AC
  Administered 2020-12-18: 4 mg via INTRAVENOUS
  Filled 2020-12-18: qty 2

## 2020-12-18 MED ORDER — ONDANSETRON 8 MG PO TBDP
8.0000 mg | ORAL_TABLET | Freq: Once | ORAL | Status: AC
  Administered 2020-12-18: 8 mg via ORAL
  Filled 2020-12-18: qty 1

## 2020-12-18 MED ORDER — HYDROMORPHONE HCL 1 MG/ML IJ SOLN
1.0000 mg | Freq: Once | INTRAMUSCULAR | Status: AC
  Administered 2020-12-18: 1 mg via INTRAVENOUS
  Filled 2020-12-18: qty 1

## 2020-12-18 MED ORDER — OMEPRAZOLE 40 MG PO CPDR: 40.0000 mg | DELAYED_RELEASE_CAPSULE | Freq: Two times a day (BID) | ORAL | 0 refills | Status: DC

## 2020-12-18 MED ORDER — HYDROCODONE-ACETAMINOPHEN 5-325 MG PO TABS
1.0000 | ORAL_TABLET | Freq: Once | ORAL | Status: AC
Start: 2020-12-18 — End: 2020-12-18
  Administered 2020-12-18: 1 via ORAL
  Filled 2020-12-18: qty 1

## 2020-12-18 NOTE — Discharge Instructions (Signed)
Take omeprazole twice daily as prescribed.  Take Zofran as needed as prescribed for nausea and vomiting.  Call GI to schedule appointment for follow-up.

## 2020-12-18 NOTE — ED Provider Notes (Signed)
[facility]-EMERGENCY DEPT Provider Note   CSN: [id number] Arrival date & time: 12/18/20  1004     History Chief Complaint  Patient presents with   Abdominal Pain    Maureen Schultz is a 20 y.o. female.  21 year old female brought in by EMS with abdominal pain onset last night. States she missed her last period, has never been pregnant, has not taken a home test. Also nausea, vomiting. Denies changes in bowel or bladder habits, vaginal discharge. Difficult to obtain  history as patient is screaming and vomiting.       Past Medical History:  Diagnosis Date   Asthma    Pulmonary atresia     There are no problems to display for this patient.   Past Surgical History:  Procedure Laterality Date   CARDIAC SURGERY     x3     OB History   No obstetric history on file.     Family History  Family history unknown: Yes    Social History   Tobacco Use   Smoking status: Never   Smokeless tobacco: Never  Vaping Use   Vaping Use: Never used  Substance Use Topics   Alcohol use: Never   Drug use: Yes    Types: Marijuana    Home Medications Prior to Admission medications   Medication Sig Start Date End Date Taking? Authorizing Provider  omeprazole (PRILOSEC) 40 MG capsule Take 1 capsule (40 mg total) by mouth 2 (two) times daily before a meal. 12/18/20 01/17/21 Yes Army Melia A, PA-C  ondansetron (ZOFRAN ODT) 4 MG disintegrating tablet Take 1 tablet (4 mg total) by mouth every 8 (eight) hours as needed for nausea or vomiting. 12/18/20  Yes Jeannie Fend, PA-C  sucralfate (CARAFATE) 1 g tablet Take 1 tablet (1 g total) by mouth 4 (four) times daily -  with meals and at bedtime. 07/27/20 08/26/20  Haskel Schroeder, PA-C    Allergies    Other, Pineapple, and Raspberry  Review of Systems   Review of Systems  Constitutional:  Negative for fever.  Respiratory:  Negative for shortness of breath.   Cardiovascular:  Negative for chest pain.   Gastrointestinal:  Positive for diarrhea, nausea and vomiting. Negative for abdominal pain and constipation.  Genitourinary:  Negative for dysuria.  Musculoskeletal:  Negative for arthralgias, back pain and myalgias.  Skin:  Negative for wound.  Allergic/Immunologic: Negative for immunocompromised state.  Neurological:  Negative for weakness.  All other systems reviewed and are negative.  Physical Exam Updated Vital Signs BP (!) 111/59   Pulse (!) 47   Temp 98.6 F (37 C)   Resp 16   SpO2 98%   Physical Exam Vitals and nursing note reviewed.  Constitutional:      Appearance: She is well-developed. She is not diaphoretic.     Comments: screaming  HENT:     Head: Normocephalic and atraumatic.  Cardiovascular:     Rate and Rhythm: Normal rate and regular rhythm.     Heart sounds: Normal heart sounds.  Pulmonary:     Effort: Pulmonary effort is normal.     Breath sounds: Normal breath sounds.  Abdominal:     Tenderness: There is generalized abdominal tenderness.  Skin:    General: Skin is warm and dry.     Findings: No erythema or rash.  Neurological:     Mental Status: She is alert and oriented to person, place, and time.  Psychiatric:  Behavior: Behavior normal.    ED Results / Procedures / Treatments   Labs (all labs ordered are listed, but only abnormal results are displayed) Labs Reviewed  CBC WITH DIFFERENTIAL/PLATELET - Abnormal; Notable for the following components:      Result Value   RBC 5.41 (*)    Hemoglobin 15.9 (*)    HCT 47.4 (*)    Lymphs Abs 0.6 (*)    All other components within normal limits  COMPREHENSIVE METABOLIC PANEL - Abnormal; Notable for the following components:   CO2 18 (*)    Glucose, Bld 111 (*)    Total Bilirubin 1.3 (*)    All other components within normal limits  URINALYSIS, ROUTINE W REFLEX MICROSCOPIC - Abnormal; Notable for the following components:   APPearance HAZY (*)    Hgb urine dipstick MODERATE (*)     Ketones, ur 20 (*)    Protein, ur 30 (*)    Bacteria, UA RARE (*)    All other components within normal limits  LIPASE, BLOOD  I-STAT BETA HCG BLOOD, ED ([facility], [facility], [facility] ONLY)  POC URINE PREG, ED    EKG None  Radiology US Transvaginal Non-OB  Result Date: 12/18/2020 CLINICAL DATA:  Pelvic pain. EXAM: TRANSABDOMINAL AND TRANSVAGINAL ULTRASOUND OF PELVIS DOPPLER ULTRASOUND OF OVARIES TECHNIQUE: Both transabdominal and transvaginal ultrasound examinations of the pelvis were performed. Transabdominal technique was performed for global imaging of the pelvis including uterus, ovaries, adnexal regions, and pelvic cul-de-sac. It was necessary to proceed with endovaginal exam following the transabdominal exam to visualize the uterus and ovaries. Color and duplex Doppler ultrasound was utilized to evaluate blood flow to the ovaries. COMPARISON:  None. FINDINGS: Uterus Measurements: 8.1 x 4.3 x 5.2 cm = volume: 96.5 mL. No fibroids or other mass visualized. Endometrium Thickness: 0.8 cm.  No focal abnormality visualized. Right ovary Measurements: 3.0 x 1.4 x 1.7 cm = volume: 3.9 mL. Normal appearance/no adnexal mass. Left ovary Measurements: 3.0 x 1.4 x 1.7 = volume: 3.9 mL. Normal appearance/no adnexal mass. Pulsed Doppler evaluation of both ovaries demonstrates normal low-resistance arterial and venous waveforms. Other findings Small to moderate amount of simple free fluid noted within the posterior cul-de-sac. IMPRESSION: Normal sonographic examination of the uterus and ovaries. Small-moderate amount of simple free fluid in the posterior cul-de-sac. Electronically Signed   By: Sherron Ales M.D.   On: 12/18/2020 13:20   US Pelvis Complete  Result Date: 12/18/2020 CLINICAL DATA:  Pelvic pain. EXAM: TRANSABDOMINAL AND TRANSVAGINAL ULTRASOUND OF PELVIS DOPPLER ULTRASOUND OF OVARIES TECHNIQUE: Both transabdominal and transvaginal ultrasound examinations of the pelvis were performed. Transabdominal technique was  performed for global imaging of the pelvis including uterus, ovaries, adnexal regions, and pelvic cul-de-sac. It was necessary to proceed with endovaginal exam following the transabdominal exam to visualize the uterus and ovaries. Color and duplex Doppler ultrasound was utilized to evaluate blood flow to the ovaries. COMPARISON:  None. FINDINGS: Uterus Measurements: 8.1 x 4.3 x 5.2 cm = volume: 96.5 mL. No fibroids or other mass visualized. Endometrium Thickness: 0.8 cm.  No focal abnormality visualized. Right ovary Measurements: 3.0 x 1.4 x 1.7 cm = volume: 3.9 mL. Normal appearance/no adnexal mass. Left ovary Measurements: 3.0 x 1.4 x 1.7 = volume: 3.9 mL. Normal appearance/no adnexal mass. Pulsed Doppler evaluation of both ovaries demonstrates normal low-resistance arterial and venous waveforms. Other findings Small to moderate amount of simple free fluid noted within the posterior cul-de-sac. IMPRESSION: Normal sonographic examination of the uterus and ovaries. Small-moderate  amount of simple free fluid in the posterior cul-de-sac. Electronically Signed   By: Sherron Ales M.D.   On: 12/18/2020 13:20   [place] Art/Ven Flow Abd Pelv Doppler  Result Date: 12/18/2020 CLINICAL DATA:  Pelvic pain. EXAM: TRANSABDOMINAL AND TRANSVAGINAL ULTRASOUND OF PELVIS DOPPLER ULTRASOUND OF OVARIES TECHNIQUE: Both transabdominal and transvaginal ultrasound examinations of the pelvis were performed. Transabdominal technique was performed for global imaging of the pelvis including uterus, ovaries, adnexal regions, and pelvic cul-de-sac. It was necessary to proceed with endovaginal exam following the transabdominal exam to visualize the uterus and ovaries. Color and duplex Doppler ultrasound was utilized to evaluate blood flow to the ovaries. COMPARISON:  None. FINDINGS: Uterus Measurements: 8.1 x 4.3 x 5.2 cm = volume: 96.5 mL. No fibroids or other mass visualized. Endometrium Thickness: 0.8 cm.  No focal abnormality visualized. Right  ovary Measurements: 3.0 x 1.4 x 1.7 cm = volume: 3.9 mL. Normal appearance/no adnexal mass. Left ovary Measurements: 3.0 x 1.4 x 1.7 = volume: 3.9 mL. Normal appearance/no adnexal mass. Pulsed Doppler evaluation of both ovaries demonstrates normal low-resistance arterial and venous waveforms. Other findings Small to moderate amount of simple free fluid noted within the posterior cul-de-sac. IMPRESSION: Normal sonographic examination of the uterus and ovaries. Small-moderate amount of simple free fluid in the posterior cul-de-sac. Electronically Signed   By: Sherron Ales M.D.   On: 12/18/2020 13:20    Procedures Procedures   Medications Ordered in ED Medications  ondansetron (ZOFRAN-ODT) disintegrating tablet 8 mg (8 mg Oral Given 12/18/20 1027)  HYDROcodone-acetaminophen (NORCO/VICODIN) 5-325 MG per tablet 1 tablet (1 tablet Oral Given 12/18/20 1115)  HYDROmorphone (DILAUDID) injection 1 mg (1 mg Intravenous Given 12/18/20 1211)  ondansetron (ZOFRAN) injection 4 mg (4 mg Intravenous Given 12/18/20 1212)  sodium chloride 0.9 % bolus 1,000 mL (1,000 mLs Intravenous New Bag/Given 12/18/20 1212)    ED Course  I have reviewed the triage vital signs and the nursing notes.  Pertinent labs & imaging results that were available during my care of the patient were reviewed by me and considered in my medical decision making (see chart for details).  Clinical Course as of 12/18/20 1409  Thu Dec 18, 2020  [id number] 20 year old female with severe pain as above. Concern for ectopic pregnancy vs torsion.  Upreg negative (unable to hold still for blood draw). [place] torsion study ordered with IV pain medications.  [LM]  1206 Chart review, seen earlier this year for undifferentiated abdominal pain, reports history of H. pylori.  Also marijuana positive, no specific diagnosis of cannabinoid emesis. [LM]  1309 Pain is controlled and patient is able to provide history now, pain woke her from her sleep at 4am, diffuse, sharp in  nature. Patient wen to the bathroom and had an episode of loose stools with vomiting.  Repeat exam, no significant tenderness at this time.  Pelvic US pending.  Labs overall reassuring including CBC, CMP.  Lipase normal.  hCG negative.  Urinalysis with ketones and protein, likely secondary to vomiting. [LM]  1408 Pain remains controlled, patient feeling better.  Ultrasound is negative for torsion.  Discussed results with patient who states this feels similar to her prior H. pylori infection.  States that she did not complete treatment when she was last diagnosed and has been unable to find a GI who does endoscopy. Patient be prescribed omeprazole, Zofran, referred to GI for follow-up after discharge today. [LM]    Clinical Course User Index [LM] Jeannie Fend, PA-C  MDM Rules/Calculators/A&P                           Final Clinical Impression(s) / ED Diagnoses Final diagnoses:  Generalized abdominal pain  Nausea vomiting and diarrhea    Rx / DC Orders ED Discharge Orders          Ordered    omeprazole (PRILOSEC) 40 MG capsule  2 times daily before meals        12/18/20 1406    ondansetron (ZOFRAN ODT) 4 MG disintegrating tablet  Every 8 hours PRN        12/18/20 1407             Jeannie Fend, PA-C 12/18/20 1409    Terrilee Files, MD 12/18/20 [phone]

## 2020-12-18 NOTE — ED Provider Notes (Signed)
Emergency Medicine Provider Triage Evaluation Note  Maureen Schultz , a 20 y.o. female  was evaluated in triage.  Pt complains of severe lower abdominal pain onset last night with nausea and vomiting. LMP 2 months ago, no home pregnancy test, denies bleeding or dc.   Review of Systems  Positive: Pelvic pain, vomiting Negative: Changes in bowel or bladder habits  Physical Exam  BP 108/66   Pulse 77   Temp 98.6 F (37 C)   Resp 20   SpO2 98%  Gen:   Awake, screaming in triage, active vomiting  Resp:  Normal effort  MSK:   Moves extremities without difficulty  Other:  Unable to assess abdominal pain secondary to pain   Medical Decision Making  Medically screening exam initiated at 10:14 AM.  Appropriate orders placed.  AJGOTL Solberg was informed that the remainder of the evaluation will be completed by another provider, this initial triage assessment does not replace that evaluation, and the importance of remaining in the ED until their evaluation is complete.     Jeannie Fend, PA-C 12/18/20 1021    Terrilee Files, MD 12/18/20 [phone]

## 2020-12-18 NOTE — ED Notes (Signed)
X2 uncessessful blood draw attempts by this Clinical research associate and Kendal Hymen EMT

## 2020-12-18 NOTE — ED Triage Notes (Signed)
Per EMS, pt complains abd pain, n/v since 5 am this morning. Hx of gastric ulcer. Reports some diarrhea. Missed her period last month. Takes Prilosec.

## 2020-12-18 NOTE — ED Notes (Signed)
Attempted to draw labs, pt vomiting and moving around stating she can't do this.

## 2021-03-23 NOTE — Progress Notes (Signed)
Date:  03/24/2021   ID:  Ellyce, Lafevers 03/19/2000, MRN [id number]  PCP:  Shanon Rosser, PA-C  Cardiologist:  Rex Kras, DO, [facility]  (established care 03/24/2021) Former Cardiology Providers: Dr. Daryl Eastern  and Dr. August Saucer   REASON FOR CONSULT: Atresia of pulmonary artery  REQUESTING PHYSICIAN:  Shanon Rosser, PA-C [address] [place],  [place] [id number]  Chief Complaint  Patient presents with   New Patient (Initial Visit)   Chest Pain   Shortness of Breath   Atresia of pulmonary artery    HPI  Makinzi Brymer is a 21 y.o. African-American female who presents to the office with a chief complaint of " history of pulmonary atresia status postrepair, chest pain and shortness of breath." Patient's past medical history and cardiovascular risk factors include: History of pulmonary atresia status post Blalock-Taussig shunt, bidirectional shunt, Fontan's surgery, asthma, marijuana use.  She is referred to the office at the request of Long, Nicki Reaper, PA-C for evaluation of atresia of pulmonary artery.  Patient is accompanied by her mother over the phone during today's office visit.  Patient provides verbal consent with regards to discussing her information in the presence of her mom.  Patient is here to establish care with cardiology given her extensive history despite her young age.  According to her mom while she was pregnant she was informed that 5 months into pregnancy that the fetus had congenital heart disease.  After the birth of her daughter patient underwent Blalock-Taussig shunt on day 3, at 79-mont[age]his was changed to bidirectional shunt, and at the age of 3.5 years underwent Fontan surgery.  Since then she has been followed up with pediatric cardiologist Dr. WDaryl Easternand Dr. FToney Rakes  [facility] [place] [facility] [place] [facility].  Patient states that she used to follow-up every 6 months with her pediatric cardi[place]  Recently she has been experiencing chest pain and shortness of breath.  Work-up recently noted influenza A infection which is resolved with treatment of Tamiflu.  Her baseline oxygen saturation is between 89-92% according to the patient.  Chest pain: Present for the last several years, occurs few times a month, randomly, located substernally, 6 out of 10 in intensity, more noticeable with over exertional activities, when she has difficulty in breathing, or has asthma flareup.  The symptoms are usually self-limited, does not resolve with rest.  Denies any near-syncope or syncopal event or heart failure.  No family history of premature coronary disease or sudden cardiac death.  FUNCTIONAL STATUS: No structured exercise program or daily routine.   ALLERGIES: Allergies  Allergen Reactions   Other     Peanuts and soy   Pineapple    Raspberry     MEDICATION LIST PRIOR TO VISIT: Current Meds  Medication Sig   albuterol (PROVENTIL) (5 MG/ML) 0.5% nebulizer solution albuterol sulfate concentrate 2.5 mg/0.5 mL solution for nebulization  Inhale 0.5 mL as needed by nebulization route for 1 day.     PAST MEDICAL HISTORY: Past Medical History:  Diagnosis Date   Asthma    Pulmonary atresia     PAST SURGICAL HISTORY: Past Surgical History:  Procedure Laterality Date   CARDIAC SURGERY     x3    FAMILY HISTORY: The patient Family history is unknown by patient.  SOCIAL HISTORY:  The patient  reports that she has never smoked. She has never used smokeless tobacco. She reports current alcohol use.  She reports current drug use. Drug: Marijuana.  REVIEW OF SYSTEMS: Review of Systems  Constitutional: Negative for chills and fever.  HENT:  Negative for hoarse voice and nosebleeds.   Eyes:  Negative for discharge, double vision and pain.  Cardiovascular:  Positive for chest pain, orthopnea (Chronic and stable) and paroxysmal nocturnal dyspnea (chronic and stable.). Negative for  claudication, dyspnea on exertion, leg swelling, near-syncope, palpitations and syncope.       Feet cooler than hands.   Respiratory:  Negative for hemoptysis and shortness of breath.   Musculoskeletal:  Negative for muscle cramps and myalgias.  Gastrointestinal:  Negative for abdominal pain, constipation, diarrhea, hematemesis, hematochezia, melena, nausea and vomiting.  Neurological:  Positive for headaches. Negative for dizziness and light-headedness.   PHYSICAL EXAM: Vitals with BMI 03/24/2021 12/18/2020 12/18/2020  Height _0  - -  Weight 121 lbs 3 oz - -  BMI 09.29 - -  Systolic 574 734 -  Diastolic 70 59 -  Pulse 53 47 50    CONSTITUTIONAL: Well-developed and well-nourished. No acute distress.  SKIN: Skin is warm and dry. No rash noted. No cyanosis. No pallor. No jaundice HEAD: Normocephalic and atraumatic.  EYES: No scleral icterus MOUTH/THROAT: Moist oral membranes.  NECK: No JVD present. No thyromegaly noted. No carotid bruits  LYMPHATIC: No visible cervical adenopathy.  CHEST Normal respiratory effort. No intercostal retractions.  Prior sternotomy scar is well-healed. LUNGS: Clear to auscultation bilaterally.  No stridor. No wheezes. No rales.  CARDIOVASCULAR: Regular rate and rhythm, positive S1-S2, no murmurs rubs or gallops appreciated. ABDOMINAL: Nonobese, soft, nontender, nondistended, positive bowel sounds in all 4 quadrants, no apparent ascites.  EXTREMITIES: No peripheral edema, warm to touch, bilateral faint PT pulses, 2+ bilateral DP pulses.   HEMATOLOGIC: No significant bruising NEUROLOGIC: Oriented to person, place, and time. Nonfocal. Normal muscle tone.  PSYCHIATRIC: Normal mood and affect. Normal behavior. Cooperative  CARDIAC DATABASE: EKG: 03/24/2021: Sinus bradycardia, 56 bpm, low voltage, LAE, left axis, consider inferior infarct, diffuse T wave inversion.  Echocardiogram: No results found for this or any previous visit from the past 1095 days.     Stress Testing: No results found for this or any previous visit from the past 1095 days.   Heart Catheterization: None  LABORATORY DATA: CBC Latest Ref Rng & Units 12/18/2020 07/29/2020 07/27/2020  WBC 4.0 - 10.5 K/uL 5.1 4.4 6.8  Hemoglobin 12.0 - 15.0 g/dL 15.9(H) 15.5(H) 16.9(H)  Hematocrit 36.0 - 46.0 % 47.4(H) 46.3(H) 52.3(H)  Platelets 150 - 400 K/uL 180 164 202    CMP Latest Ref Rng & Units 12/18/2020 07/29/2020 07/27/2020  Glucose 70 - 99 mg/dL 111(H) 82 119(H)  BUN 6 - 20 mg/dL _1 Creatinine 0.44 - 1.00 mg/dL 0.91 0.76 1.01(H)  Sodium 135 - 145 mmol/L 138 136 137  Potassium 3.5 - 5.1 mmol/L 4.5 3.8 4.5  Chloride 98 - 111 mmol/L 110 107 106  CO2 22 - 32 mmol/L 18(L) 21(L) 20(L)  Calcium 8.9 - 10.3 mg/dL 9.7 9.0 10.0  Total Protein 6.5 - 8.1 g/dL 7.4 7.0 8.3(H)  Total Bilirubin 0.3 - 1.2 mg/dL 1.3(H) 1.2 1.5(H)  Alkaline Phos 38 - 126 U/L 67 66 77  AST 15 - 41 U/L 38 25 38  ALT 0 - 44 U/L _2 Lipid Panel  No results found for: CHOL, TRIG, HDL, CHOLHDL, VLDL, LDLCALC, LDLDIRECT, LABVLDL  No components found for: NTPROBNP No results for input(s): PROBNP in the last  8760 hours. No results for input(s): TSH in the last 8760 hours.  BMP Recent Labs    07/27/20 1731 07/29/20 1312 12/18/20 1205  NA 137 136 138  K 4.5 3.8 4.5  CL 106 107 110  CO2 20* 21* 18*  GLUCOSE 119* 82 111*  BUN _0 CREATININE 1.01* 0.76 0.91  CALCIUM 10.0 9.0 9.7  GFRNONAA >60 >60 >60    HEMOGLOBIN A1C No results found for: HGBA1C, MPG  IMPRESSION:  External Labs:  Date Collected: 02/28/2021 , information obtained by [facility]  Potassium: 4.1 BUN 11, creatinine 0.94 mg/dL. AST 24, ALT 29, alkaline phosphatase 63 eGFR: 88 mL/min per 1.73 m Hemoglobin: 17.6 g/dL and hematocrit: 52.3 % TSH: 1.60      ICD-10-CM   1. Atresia of pulmonary artery  Q25.5 EKG 12-Lead    Ambulatory referral to Cardiology    2. Precordial pain  R07.2     3. Shortness of breath   R06.02        RECOMMENDATIONS: Satine Bewick is a 21 y.o. African-American female whose past medical history and cardiac risk factors include: History of pulmonary atresia status post Blalock-Taussig shunt, bidirectional shunt, Fontan's surgery, asthma, marijuana use.  Patient presents today to establish care as she recently moved from [place] to [place].  It appears the patient has had an extensive history of congenital heart disease and now is in need of an adult congenital specialist within the community.  According to the patient's mother patient has undergone Blalock-Taussig shunt, bidirectional shunt, followed by Fontan surgery.  She is overall relatively stable, euvolemic, and not in congestive heart failure.  She has been experiencing chest pain that is been present for the last several years and the overall intensity, frequency, and duration has not changed.  The underlying rhythm is sinus but she does have diffuse T wave inversions.  I do not have any prior EKGs for comparison to see if these are new findings versus old.  She recently has noted improvement in both the chest pain and shortness of breath after being treated for influenza infection last month.  I have also asked her to stop smoking marijuana but given her cardiac history.  I will refer her to adult congenital specialist Dr. Jeralyn Bennett to establish care given her history.  I spoke to both the patient and mother at length that with regards to longitudinal care from cardiovascular standpoint it would be in her best interest to follow-up with adult congenital cardiology.  I will request records from her former cardiologist Dr. Rudi Rummage in [place] and will if and when records are available uploaded in [facility] for reestablish continuity of care.  For now I will hold off on ordering additional testing.  But she would benefit from repeat echocardiogram, possible cardiac MRI, and ischemic  work-up.  In the meantime, if her symptoms of chest pain or shortness of breath become progressive she is asked to call the office for reevaluation and if the office is unavailable/closed she is encouraged to go to the closest ER via EMS for further evaluation and management.  Both the patient and mother verbalized understanding.  FINAL MEDICATION LIST END OF ENCOUNTER: No orders of the defined types were placed in this encounter.   Medications Discontinued During This Encounter  Medication Reason   omeprazole (PRILOSEC) 40 MG capsule    ondansetron (ZOFRAN ODT) 4 MG disintegrating tablet    sucralfate (CARAFATE) 1 g tablet  Current Outpatient Medications:    albuterol (PROVENTIL) (5 MG/ML) 0.5% nebulizer solution, albuterol sulfate concentrate 2.5 mg/0.5 mL solution for nebulization  Inhale 0.5 mL as needed by nebulization route for 1 day., Disp: , Rfl:   Orders Placed This Encounter  Procedures   Ambulatory referral to Cardiology   EKG 12-Lead    There are no Patient Instructions on file for this visit.   --Continue cardiac medications as reconciled in final medication list.  In people who has had prior history of prior surgeries --Return in about 3 months (around 06/22/2021) for Follow up history of pulmonary atresia.. Or sooner if needed. --Continue follow-up with your primary care physician regarding the management of your other chronic comorbid conditions.  Patient's questions and concerns were addressed to her satisfaction. She voices understanding of the instructions provided during this encounter.   This note was created using a voice recognition software as a result there may be grammatical errors inadvertently enclosed that do not reflect the nature of this encounter. Every attempt is made to correct such errors.  Rex Kras, [place], [facility]  Pager: [phone] Office: [phone]

## 2021-03-24 ENCOUNTER — Other Ambulatory Visit: Payer: Self-pay

## 2021-03-24 ENCOUNTER — Ambulatory Visit: Payer: Commercial Managed Care - PPO | Admitting: Cardiology

## 2021-03-24 ENCOUNTER — Encounter: Payer: Self-pay | Admitting: Cardiology

## 2021-03-24 VITALS — BP 118/70 | HR 53 | Temp 97.9°F | Resp 16 | Ht 63.0 in | Wt 121.2 lb

## 2021-03-24 DIAGNOSIS — Q255 Atresia of pulmonary artery: Secondary | ICD-10-CM

## 2021-03-24 DIAGNOSIS — R072 Precordial pain: Secondary | ICD-10-CM

## 2021-03-24 DIAGNOSIS — R0602 Shortness of breath: Secondary | ICD-10-CM

## 2021-03-26 ENCOUNTER — Institutional Professional Consult (permissible substitution): Payer: Commercial Managed Care - PPO | Admitting: Internal Medicine

## 2021-03-26 ENCOUNTER — Ambulatory Visit: Payer: Commercial Managed Care - PPO | Admitting: Internal Medicine

## 2021-03-26 ENCOUNTER — Other Ambulatory Visit: Payer: Self-pay

## 2021-03-26 ENCOUNTER — Ambulatory Visit (INDEPENDENT_AMBULATORY_CARE_PROVIDER_SITE_OTHER): Payer: Commercial Managed Care - PPO

## 2021-03-26 ENCOUNTER — Encounter: Payer: Self-pay | Admitting: Internal Medicine

## 2021-03-26 VITALS — BP 106/70 | HR 81 | Temp 97.6°F | Ht 62.0 in | Wt 120.6 lb

## 2021-03-26 DIAGNOSIS — Q220 Pulmonary valve atresia: Secondary | ICD-10-CM

## 2021-03-26 DIAGNOSIS — Z8709 Personal history of other diseases of the respiratory system: Secondary | ICD-10-CM

## 2021-03-26 LAB — CBC WITH DIFFERENTIAL/PLATELET
Basophils Absolute: 0 10*3/uL (ref 0.0–0.1)
Basophils Relative: 0.3 % (ref 0.0–3.0)
Eosinophils Absolute: 0.1 10*3/uL (ref 0.0–0.7)
Eosinophils Relative: 1.2 % (ref 0.0–5.0)
HCT: 46.3 % — ABNORMAL HIGH (ref 36.0–46.0)
Hemoglobin: 14.7 g/dL (ref 12.0–15.0)
Lymphocytes Relative: 29.1 % (ref 12.0–46.0)
Lymphs Abs: 1.7 10*3/uL (ref 0.7–4.0)
MCHC: 31.7 g/dL (ref 30.0–36.0)
MCV: 88.6 fl (ref 78.0–100.0)
Monocytes Absolute: 0.8 10*3/uL (ref 0.1–1.0)
Monocytes Relative: 14.7 % — ABNORMAL HIGH (ref 3.0–12.0)
Neutro Abs: 3.1 10*3/uL (ref 1.4–7.7)
Neutrophils Relative %: 54.7 % (ref 43.0–77.0)
Platelets: 257 10*3/uL (ref 150.0–400.0)
RBC: 5.23 Mil/uL — ABNORMAL HIGH (ref 3.87–5.11)
RDW: 14.6 % (ref 11.5–15.5)
WBC: 5.8 10*3/uL (ref 4.0–10.5)

## 2021-03-26 NOTE — Progress Notes (Signed)
OV 03/26/2021  Subjective:  Patient ID: Maureen Schultz, female , DOB: 03-04-2001 , age 21 y.o. , MRN: [id number] , ADDRESS: [address] 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 Pt states she recently was diagnosed with flu and pna and states that after having a recent cxr, she was told that her lungs are still filled with fluid. Pt states that she does have complaints of chest discomfort and an occasional cough.     HPI Maureen Schultz 21 y.o. -new consult evaluation in the pulmonary clinic.  She presents by herself but her mom is on the phone listening to the conversation.  Patient was born and raised in [place].  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  She was born with pulmonary atresia and at [facility] in [place] status post Blalock-Taussig shunt [bidirectional shunt) and Fontan surgery done for tricuspid atresia.  She recollects a total of 3 open heart surgeries.  She says she on a baseline has continues on and off chest pain and shortness of breath with exertion.  Somewhere along the way she has been given a diagnosis of asthma not otherwise specified.  She uses albuterol for rescue.  She is not on any maintenance inhalers.  Is unclear based on severity of asthma.  After relocating to [facility] in the spring 2022 she does not have a cardiologist but has primary care.  She believes at baseline because of her surgeries [her mom reported that she had a VSD created] her pulse ox runs around 86 to 88% at baseline.  She  says she is never been on oxygen other than when she is in the hospital.   With the above usual state of health early December 2020 for which she ended up getting influenza.  She went to primary care and apparently chest x-ray showed "fluid in the lungs".  I do not have the images with me.  She says she was treated with 2 course of antibiotics and then mid December 2022 had another chest x-ray that still showed fluid in the lungs.  She says she is never known to have "fluid in the lungs".  Her last hospitalization was in November 2021 in [place] for stomach issues.  She says at that time chest x-ray did not show any fluid.  So she believes the fluid problem is new.  She says is since the influenza she is better but she still has ongoing shortness of breath and the chest tightness.  She believes it is worse than her baseline.  But there is no nocturnal symptoms.  She saw Dr. Verneda Skill on 03/24/2021 of [place] [facility].  He is referred her to [facility] cardiology congenital clinic.  She has an appointment today.   U tox - may 2022 - THC +     Her walking desaturation test shows a tendency to desaturate.  It seems the finger probe is worse than the forehead probe.  Simple office walk 185 feet x  3 laps goal with forehead  probe 03/26/2021    O2 used Ra -initially finger pulse ox showed 86% room air at rest but with a forehead probe this was 93%   Number laps completed 3   Comments about pace Nl pace   Resting Pulse Ox/HR 93% and 80/min   Final Pulse Ox/HR 89% and 92/min   Desaturated </= 88% no   Desaturated <= 3% points yes   Got Tachycardic >/= 90/min yes   Symptoms at end of test Mild dyspnea   Miscellaneous comments x       No flowsheet data found.     CT Chest data  No results found.    PFT  No flowsheet data found.     has a past medical history of Asthma and Pulmonary atresia.   reports that she has never smoked. She has never used smokeless  tobacco.  Past Surgical History:  Procedure Laterality Date   CARDIAC SURGERY     x3    Allergies  Allergen Reactions   Other     Peanuts and soy   Pineapple    Raspberry     Immunization History  Administered Date(s) Administered   PFIZER(Purple Top)SARS-COV-2 Vaccination 11/19/2019, 12/10/2019    Family History  Family history unknown: Yes     Current Outpatient Medications:    albuterol (PROVENTIL) (5 MG/ML) 0.5% nebulizer solution, albuterol sulfate concentrate 2.5 mg/0.5 mL solution for nebulization  Inhale 0.5 mL as needed by nebulization route for 1 day., Disp: , Rfl:       Objective:   Vitals:   03/26/21 1129 03/26/21 1133  BP:  106/70  Pulse: 85 81  Temp:  97.6 F (36.4 C)  TempSrc:  Oral  SpO2: (!) 86% 93%  Weight:  120 lb 9.6 oz (54.7 kg)  Height:  5\' 2"  (1.575 m)    Estimated body mass index is 22.06 kg/m as calculated from the following:   Height as of this encounter: 5\' 2"  (1.575 m).   Weight as of this encounter: 120 lb 9.6 oz (54.7 kg).  @WEIGHTCHANGE @  [employer]   03/26/21 1133  Weight: 120 lb 9.6 oz (54.7 kg)     Physical Exam  General: No distress.  Thin and tall well-built.  No distress Neuro: Alert and Oriented x 3. GCS 15. Speech normal Psych: Pleasant Resp:  Barrel Chest - no.  Wheeze - no, Crackles - no, No overt respiratory distress CVS: Normal heart sounds. Murmurs - no Ext: Stigmata of Connective Tissue Disease - no HEENT: Normal upper airway. PEERL +. No post nasal drip        Assessment:       ICD-10-CM   1. History of influenza  Z87.09     2. History of asthma  Z87.09     3. Pulmonary atresia  Q22.0      With a forehead probe her hypoxemia is not as pronounced as with the finger probe.  In any event hypoxemia is likely to be because of shunt.  At this point in time we need to investigate the history of abnormal chest x-ray in the context of asthma and also her cardiac issues.  Her cardiac issues are  complex and [facility]  We will wait to see the data on this.  Meanwhile we will look at the history of asthma and a recent abnormal chest x-ray with "fluid in the lungs".  Therefore get a chest x-ray, CBC with differential blood RAST allergy profile  and blood IgE.  We will also get a full pulmonary function test with nitric oxide testing.  After that we will regroup   We will have to talk to her about quitting marijuana at some point    Plan:     Patient Instructions     ICD-10-CM   1. History of influenza  Z87.09     2. History of asthma  Z87.09     3. Pulmonary atresia  Q22.0       Plan  - Do chest x-ray two-view - Do CBC with differential and RAST allergy profile and blood IgE -Do full pulmonary function test -Do breathing test: Exhaled nitric oxide -Keep appointment with [facility] cardiology today  Follow-up - Return to see Dr. Marchelle Gearing or nurse practitioner in the next 4 weeks to discuss test results    SIGNATURE    Dr. Kalman Shan, M.D., F.C.C.P,  Pulmonary and Critical Care Medicine Staff Physician, [facility] Director - Interstitial Lung Disease  Program  Pulmonary Fibrosis [facility] [place], [place], [address]  Pager: [phone], If no answer or between  15:00h - 7:00h: call [phone] Telephone: [phone]  12:12 PM 03/26/2021

## 2021-03-26 NOTE — Patient Instructions (Addendum)
ICD-10-CM   1. History of influenza  Z87.09     2. History of asthma  Z87.09     3. Pulmonary atresia  Q22.0       Plan  - Do chest x-ray two-view - Do CBC with differential and RAST allergy profile and blood IgE -Do full pulmonary function test -Do breathing test: Exhaled nitric oxide -Keep appointment with [facility] cardiology today  Follow-up - Return to see Dr. Marchelle Gearing or nurse practitioner in the next 4 weeks to discuss test results

## 2021-03-26 NOTE — Progress Notes (Signed)
Cxr is clear except ol d surgical changes. Let her know

## 2021-03-27 LAB — RESPIRATORY ALLERGY PROFILE REGION II ~~LOC~~

## 2021-03-27 LAB — INTERPRETATION:

## 2021-04-06 ENCOUNTER — Institutional Professional Consult (permissible substitution): Payer: Commercial Managed Care - PPO | Admitting: Pulmonary Disease

## 2021-04-24 ENCOUNTER — Ambulatory Visit (INDEPENDENT_AMBULATORY_CARE_PROVIDER_SITE_OTHER)
Admission: EM | Admit: 2021-04-24 | Discharge: 2021-04-24 | Disposition: A | Discharge status: Other Acute Inpatient Hospital | Payer: Commercial Managed Care - PPO | Source: Home / Self Care

## 2021-04-24 ENCOUNTER — Other Ambulatory Visit: Payer: Self-pay

## 2021-04-24 ENCOUNTER — Emergency Department (HOSPITAL_COMMUNITY)
Admission: EM | Admit: 2021-04-24 | Discharge: 2021-04-24 | Disposition: A | Discharge status: Home / Self Care | Payer: Commercial Managed Care - PPO | Attending: Emergency Medicine | Admitting: Emergency Medicine

## 2021-04-24 DIAGNOSIS — Z91040 Latex allergy status: Secondary | ICD-10-CM | POA: Insufficient documentation

## 2021-04-24 DIAGNOSIS — R630 Anorexia: Secondary | ICD-10-CM | POA: Insufficient documentation

## 2021-04-24 DIAGNOSIS — Z7982 Long term (current) use of aspirin: Secondary | ICD-10-CM | POA: Diagnosis not present

## 2021-04-24 DIAGNOSIS — Z20822 Contact with and (suspected) exposure to covid-19: Secondary | ICD-10-CM | POA: Diagnosis not present

## 2021-04-24 DIAGNOSIS — F314 Bipolar disorder, current episode depressed, severe, without psychotic features: Secondary | ICD-10-CM | POA: Insufficient documentation

## 2021-04-24 DIAGNOSIS — F3130 Bipolar disorder, current episode depressed, mild or moderate severity, unspecified: Secondary | ICD-10-CM

## 2021-04-24 DIAGNOSIS — F321 Major depressive disorder, single episode, moderate: Secondary | ICD-10-CM

## 2021-04-24 DIAGNOSIS — F331 Major depressive disorder, recurrent, moderate: Secondary | ICD-10-CM | POA: Diagnosis present

## 2021-04-24 LAB — COMPREHENSIVE METABOLIC PANEL
ALT: 23 U/L (ref 0–44)
AST: 27 U/L (ref 15–41)
Albumin: 4.9 g/dL (ref 3.5–5.0)
Alkaline Phosphatase: 81 U/L (ref 38–126)
Anion gap: 13 (ref 5–15)
BUN: 18 mg/dL (ref 6–20)
CO2: 19 mmol/L — ABNORMAL LOW (ref 22–32)
Calcium: 10.1 mg/dL (ref 8.9–10.3)
Chloride: 106 mmol/L (ref 98–111)
Creatinine, Ser: 0.97 mg/dL (ref 0.44–1.00)
GFR, Estimated: >60 mL/min (ref >60)
Glucose, Bld: 83 mg/dL (ref 70–99)
Potassium: 4.5 mmol/L (ref 3.5–5.1)
Sodium: 138 mmol/L (ref 135–145)
Total Bilirubin: 1.3 mg/dL — ABNORMAL HIGH (ref 0.3–1.2)
Total Protein: 8.4 g/dL — ABNORMAL HIGH (ref 6.5–8.1)

## 2021-04-24 LAB — POCT URINE DRUG SCREEN - MANUAL ENTRY (I-SCREEN)
POC Amphetamine UR: NOT DETECTED
POC Buprenorphine (BUP): NOT DETECTED
POC Cocaine UR: NOT DETECTED
POC Marijuana UR: POSITIVE — AB
POC Methadone UR: NOT DETECTED
POC Methamphetamine UR: NOT DETECTED
POC Morphine: NOT DETECTED
POC Oxazepam (BZO): NOT DETECTED
POC Oxycodone UR: NOT DETECTED
POC Secobarbital (BAR): NOT DETECTED

## 2021-04-24 LAB — CBC WITH DIFFERENTIAL/PLATELET
Abs Immature Granulocytes: 0.01 10*3/uL (ref 0.00–0.07)
Basophils Absolute: 0.1 10*3/uL (ref 0.0–0.1)
Basophils Relative: 1 %
Eosinophils Absolute: 0.1 10*3/uL (ref 0.0–0.5)
Eosinophils Relative: 1 %
HCT: 55.5 % — ABNORMAL HIGH (ref 36.0–46.0)
Hemoglobin: 18 g/dL — ABNORMAL HIGH (ref 12.0–15.0)
Immature Granulocytes: 0 %
Lymphocytes Relative: 41 %
Lymphs Abs: 2.6 10*3/uL (ref 0.7–4.0)
MCH: 28 pg (ref 26.0–34.0)
MCHC: 32.4 g/dL (ref 30.0–36.0)
MCV: 86.2 fL (ref 80.0–100.0)
Monocytes Absolute: 0.7 10*3/uL (ref 0.1–1.0)
Monocytes Relative: 10 %
Neutro Abs: 3 10*3/uL (ref 1.7–7.7)
Neutrophils Relative %: 47 %
Platelets: 271 10*3/uL (ref 150–400)
RBC: 6.44 MIL/uL — ABNORMAL HIGH (ref 3.87–5.11)
RDW: 13.2 % (ref 11.5–15.5)
WBC: 6.4 10*3/uL (ref 4.0–10.5)
nRBC: 0 % (ref 0.0–0.2)

## 2021-04-24 LAB — HEMOGLOBIN A1C
Hgb A1c MFr Bld: 5 % (ref 4.8–5.6)
Mean Plasma Glucose: 96.8 mg/dL

## 2021-04-24 LAB — MAGNESIUM: Magnesium: 2.1 mg/dL (ref 1.7–2.4)

## 2021-04-24 LAB — ETHANOL: Alcohol, Ethyl (B): <10 mg/dL (ref <10)

## 2021-04-24 LAB — RESP PANEL BY RT-PCR (FLU A&B, COVID) ARPGX2
Influenza A by PCR: NEGATIVE
Influenza B by PCR: NEGATIVE
SARS Coronavirus 2 by RT PCR: NEGATIVE

## 2021-04-24 LAB — TSH: TSH: 1.96 u[IU]/mL (ref 0.350–4.500)

## 2021-04-24 LAB — ACETAMINOPHEN LEVEL: Acetaminophen (Tylenol), Serum: <10 ug/mL — ABNORMAL LOW (ref 10–30)

## 2021-04-24 LAB — POC SARS CORONAVIRUS 2 AG -  ED: SARS Coronavirus 2 Ag: NEGATIVE

## 2021-04-24 LAB — SALICYLATE LEVEL: Salicylate Lvl: <7 mg/dL — ABNORMAL LOW (ref 7.0–30.0)

## 2021-04-24 LAB — PREGNANCY, URINE: Preg Test, Ur: NEGATIVE

## 2021-04-24 MED ORDER — ACETAMINOPHEN 325 MG PO TABS: 650.0000 mg | ORAL_TABLET | Freq: Four times a day (QID) | ORAL | Status: DC | PRN

## 2021-04-24 MED ORDER — TRAZODONE HCL 50 MG PO TABS: 50.0000 mg | ORAL_TABLET | Freq: Every evening | ORAL | Status: DC | PRN

## 2021-04-24 MED ORDER — HYDROXYZINE HCL 25 MG PO TABS: 25.0000 mg | ORAL_TABLET | Freq: Three times a day (TID) | ORAL | Status: DC | PRN

## 2021-04-24 MED ORDER — MAGNESIUM HYDROXIDE 400 MG/5ML PO SUSP: 30.0000 mL | Freq: Every day | ORAL | Status: DC | PRN

## 2021-04-24 MED ORDER — ALUM & MAG HYDROXIDE-SIMETH 200-200-20 MG/5ML PO SUSP: 30.0000 mL | ORAL | Status: DC | PRN

## 2021-04-24 NOTE — ED Triage Notes (Addendum)
Pt bib [employer] from [facility] for medical evaluation. [employer] was called out to do a wellness check on the pt. Pt was taken to [facility]. Pt states that she was "looking for a therapist and fo resources when I went to [facility], I do not feel suicidal or homicidal, I am the happiest I have been the past 2 years." Pt told RN she was diagnosed with PTSD, anxiety and seasonal depression in her past. Pt sees a cardiologist for her heart and the only medication she takes is apsirin. Pt has IVC papers.

## 2021-04-24 NOTE — ED Notes (Signed)
[employer] here at this time to serve and transport to [facility].  Pt cooperative and escorted off unit.

## 2021-04-24 NOTE — ED Provider Notes (Signed)
Behavioral Health Admission H&P [facility] & OBS)  Date: 04/24/21 Patient Name: Maureen Schultz MRN: [id number] Chief Complaint:  Chief Complaint  Patient presents with   Depression    21 year old present to [facility] via [employer] after a friend called requesting a       Diagnoses:  Final diagnoses:  Bipolar I disorder, most recent episode depressed [facility])    HPI: Patient presents voluntarily to [facility] behavioral health for walk-in assessment.  Patient is transported by Event organiser after a friend called Event organiser for wellness check.   LFYBOF texted a friend earlier today, text included phrase "I will always love you in the flesh or in the spirit." Maureen Schultz reports she sent this text to represent "whether I was dead or alive."   Patient reports feeling depressed and hopeless, passively suicidal. She reports feeling suicidal "like I don't want to live anymore" intermittently since age 75. Today, she endorses passive suicidal ideations and is unable to contract for safety at this time. No plan of suicide reported.   Maureen Schultz has recently experienced decreased sleep and decreased appetite.  She reports depressed mood.  She reports in May 2022 she weighed 186 pounds, per medical record she is currently 120 pounds.  She states "I go into dark places, I have gone into dark places."  She describes an episode in December 2022 when she stayed awake for more than 24 hours cleaning her home. She reports labile mood.   Maureen Schultz shares that she was diagnosed with depression at age 9.  She was diagnosed with bipolar disorder, PTSD and anxiety at age 89.  She was followed by outpatient therapy, last met with therapist 1 year ago.  She is not linked with outpatient psychiatry currently. She denies history of inpatient psychiatric hospitalization. Family history includes patient's father who has been diagnosed with bipoalr disorder and schizophrenia.  She reports she has not used medications and does not feel  comfortable taking any medications without them first being cleared by her cardiologist.  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   Patient is assessed face-to-face by nurse practitioner.  She is seated in assessment area, no acute distress.  She is alert and oriented,  cooperative during assessment.  She presents with depressed mood, flat affect. She denies homicidal ideations.  She denies history of suicide attempts, reports history of suicidal ideations. She  denies recent non suicidal self-harm behavior, last self-harm behavior when patient was 21years old, cutting behavior.  She has normal speech and behavior.  She denies both auditory and visual hallucinations.  Patient is able to converse coherently with goal-directed thoughts and no distractibility or preoccupation.  She denies paranoia.  Objectively there is no evidence of psychosis/mania or delusional thinking.  [name] resides in [place], she lives alone with her pets.  She denies access to weapons.  She is currently out of work, receiving disability since December 2022.  She reports she is out of work related to ongoing physical illness.  She endorses alcohol use.  Reports consuming alcohol "every weekend."  For 2 days each week she consumes approximately 10 or more drinks.  She endorses history of blacking out, denies history of alcohol induced seizure.  She reports marijuana use.  Typically uses marijuana every other day, last use of marijuana 2 days ago.  She denies substance use aside from alcohol and marijuana.  Patient offered support and encouragement. She verbalizes that she does not want to be admitted to inpatient treatment.  PHQ 2-9:   Maureen Schultz ED from 12/18/2020 in [facility]PT ED from 07/29/2020 in [facility] ED from 07/27/2020 in [facility]  C-SSRS RISK CATEGORY No Risk No Risk No Risk         Total Time spent with patient: 45 minutes  Musculoskeletal  Strength & Muscle Tone: within normal limits Gait & Station: normal Patient leans: N/A  Psychiatric Specialty Exam  Presentation General Appearance: Appropriate for Environment; Casual  Eye Contact:Fair  Speech:Clear and Coherent; Normal Rate  Speech Volume:Decreased  Handedness:Right   Mood and Affect  Mood:Depressed  Affect:Congruent; Depressed   Thought Process  Thought Processes:Coherent; Goal Directed; Linear  Descriptions of Associations:Intact  Orientation:Full (Time, Place and Person)  Thought Content:Logical; WDL    Hallucinations:Hallucinations: None  Ideas of Reference:None  Suicidal Thoughts:Suicidal Thoughts: Yes, Passive SI Passive Intent and/or Plan: Without Intent; Without Plan  Homicidal Thoughts:Homicidal Thoughts: No   Sensorium  Memory:Immediate Good; Recent Good  Judgment:Fair  Insight:Fair   Executive Functions  Concentration:Good  Attention Span:Good  Recall:Good  Fund of Knowledge:Good  Language:Good   Psychomotor Activity  Psychomotor Activity:Psychomotor Activity: Normal   Assets  Assets:Desire for Improvement; Armed forces logistics/support/administrative officer; Financial Resources/Insurance; Housing; Intimacy; Leisure Time; Resilience   Sleep  Sleep:Sleep: Poor   Nutritional Assessment (For OBS and FBC admissions only) Has the patient had a weight loss or gain of 10 pounds or more in the last 3 months?: Yes Has the patient had a decrease in food intake/or appetite?: Yes Does the patient have dental problems?: No Does the patient have eating habits or behaviors that may be indicators of an eating disorder including binging or inducing vomiting?: No Has the patient recently lost weight without trying?: 4 Has the patient been eating poorly because of a decreased appetite?: 1 Malnutrition Screening Tool Score: 5 Nutritional Assessment Referrals: Medication/Tx  changes    Physical Exam Vitals and nursing note reviewed.  Constitutional:      Appearance: Normal appearance. She is well-developed.  HENT:     Head: Normocephalic and atraumatic.     Nose: Nose normal.  Cardiovascular:     Rate and Rhythm: Normal rate.  Pulmonary:     Effort: Pulmonary effort is normal.  Musculoskeletal:        General: Normal range of motion.     Cervical back: Normal range of motion.  Skin:    General: Skin is warm and dry.  Neurological:     Mental Status: She is alert and oriented to person, place, and time.  Psychiatric:        Attention and Perception: Attention and perception normal.        Mood and Affect: Affect normal. Mood is depressed.        Speech: Speech normal.        Behavior: Behavior normal. Behavior is cooperative.        Thought Content: Thought content includes suicidal ideation.        Cognition and Memory: Cognition and memory normal.   Review of Systems  Constitutional:  Positive for weight loss.       Patient reports weight loss approx 66 pounds since May 2022  HENT: Negative.    Eyes: Negative.   Respiratory: Negative.    Cardiovascular: Negative.   Gastrointestinal: Negative.   Genitourinary: Negative.   Musculoskeletal: Negative.   Skin: Negative.   Neurological: Negative.   Endo/Heme/Allergies: Negative.   Psychiatric/Behavioral:  Positive for depression, substance  abuse and suicidal ideas. The patient has insomnia.    Blood pressure 113/81, pulse (!) 106, temperature 98.9 F (37.2 C), temperature source Oral, resp. rate 20, SpO2 98 %. There is no height or weight on file to calculate BMI.  Past Psychiatric History: per patient report MDD, PTSD, GAD, bipolar disorder  Is the patient at risk to self? Yes  Has the patient been a risk to self in the past 6 months? Yes .    Has the patient been a risk to self within the distant past? Yes   Is the patient a risk to others? No   Has the patient been a risk to others in  the past 6 months? No   Has the patient been a risk to others within the distant past? No   Past Medical History:  Past Medical History:  Diagnosis Date   Asthma    Pulmonary atresia     Past Surgical History:  Procedure Laterality Date   CARDIAC SURGERY     x3    Family History:  Family History  Family history unknown: Yes    Social History:  Social History   Socioeconomic History   Marital status: Single    Spouse name: Not on file   Number of children: Not on file   Years of education: Not on file   Highest education level: Not on file  Occupational History   Not on file  Tobacco Use   Smoking status: Never   Smokeless tobacco: Never  Vaping Use   Vaping Use: Never used  Substance and Sexual Activity   Alcohol use: Yes    Comment: social   Drug use: Yes    Types: Marijuana   Sexual activity: Not on file  Other Topics Concern   Not on file  Social History Narrative   Not on file   Social Determinants of Health   Financial Resource Strain: Not on file  Food Insecurity: Not on file  Transportation Needs: Not on file  Physical Activity: Not on file  Stress: Not on file  Social Connections: Not on file  Intimate Partner Violence: Not on file    SDOH:  SDOH Screenings   Alcohol Screen: Not on file  Depression (PHQ2-9): Not on file  Financial Resource Strain: Not on file  Food Insecurity: Not on file  Housing: Not on file  Physical Activity: Not on file  Social Connections: Not on file  Stress: Not on file  Tobacco Use: Low Risk    Smoking Tobacco Use: Never   Smokeless Tobacco Use: Never   Passive Exposure: Not on file  Transportation Needs: Not on file    Last Labs:  Office Visit on 03/26/2021  Component Date Value Ref Range Status   Allergen, D pternoyssinus,d7 03/26/2021 <0.10  kU/L Final   Class 03/26/2021 0   Final   D. farinae 03/26/2021 <0.10  kU/L Final   Class 03/26/2021 0   Final   Allergen, P. notatum, m1 03/26/2021 <0.10   kU/L Final   Class 03/26/2021 0   Final   CLADOSPORIUM HERBARUM (M2) IGE 03/26/2021 <0.10  kU/L Final   Class 03/26/2021 0   Final   Aspergillus fumigatus, m3 03/26/2021 <0.10  kU/L Final   Class 03/26/2021 0   Final   Allergen, A. alternata, m6 03/26/2021 <0.10  kU/L Final   Class 03/26/2021 0   Final   Cat Dander 03/26/2021 <0.10  kU/L Final   Class 03/26/2021 0   Final  Dog Dander 03/26/2021 <0.10  kU/L Final   Class 03/26/2021 0   Final   Cockroach 03/26/2021 <0.10  kU/L Final   Class 03/26/2021 0   Final   Box Elder IgE 03/26/2021 <0.10  kU/L Final   Class 03/26/2021 0   Final   Allergen, Comm Silver Wendee Copp, t9 03/26/2021 <0.10  kU/L Final   Class 03/26/2021 0   Final   Allergen, Cedar tree, t12 03/26/2021 <0.10  kU/L Final   Class 03/26/2021 0   Final   Allergen, Cottonwood, t14 03/26/2021 <0.10  kU/L Final   Class 03/26/2021 0   Final   Allergen, Oak,t7 03/26/2021 <0.10  kU/L Final   Class 03/26/2021 0   Final   Elm IgE 03/26/2021 <0.10  kU/L Final   Class 03/26/2021 0   Final   Pecan/Hickory Tree IgE 03/26/2021 <0.10  kU/L Final   Class 03/26/2021 0   Final   Allergen, Mulberry, t76 03/26/2021 <0.10  kU/L Final   Class 03/26/2021 0   Final   Guatemala Grass 03/26/2021 <0.10  kU/L Final   Class 03/26/2021 0   Final   Timothy Grass 03/26/2021 <0.10  kU/L Final   Class 03/26/2021 0   Final   Johnson Grass 03/26/2021 <0.10  kU/L Final   Class 03/26/2021 0   Final   COMMON RAGWEED (SHORT) (W1) IGE 03/26/2021 <0.10  kU/L Final   Class 03/26/2021 0   Final   Rough Pigweed  IgE 03/26/2021 <0.10  kU/L Final   Class 03/26/2021 0   Final   Sheep Sorrel IgE 03/26/2021 <0.10  kU/L Final   Class 03/26/2021 0   Final   Allergen, Mouse Urine Protein, e78 03/26/2021 <0.10  kU/L Final   Class 03/26/2021 0   Final   IgE (Immunoglobulin E), Serum 03/26/2021 11  <OR=114 kU/L Final   WBC 03/26/2021 5.8  4.0 - 10.5 K/uL Final   RBC 03/26/2021 5.23 (H)  3.87 - 5.11 Mil/uL Final    Hemoglobin 03/26/2021 14.7  12.0 - 15.0 g/dL Final   HCT 03/26/2021 46.3 (H)  36.0 - 46.0 % Final   MCV 03/26/2021 88.6  78.0 - 100.0 fl Final   MCHC 03/26/2021 31.7  30.0 - 36.0 g/dL Final   RDW 03/26/2021 14.6  11.5 - 15.5 % Final   Platelets 03/26/2021 257.0  150.0 - 400.0 K/uL Final   Neutrophils Relative % 03/26/2021 54.7  43.0 - 77.0 % Final   Lymphocytes Relative 03/26/2021 29.1  12.0 - 46.0 % Final   Monocytes Relative 03/26/2021 14.7 (H)  3.0 - 12.0 % Final   Eosinophils Relative 03/26/2021 1.2  0.0 - 5.0 % Final   Basophils Relative 03/26/2021 0.3  0.0 - 3.0 % Final   Neutro Abs 03/26/2021 3.1  1.4 - 7.7 K/uL Final   Lymphs Abs 03/26/2021 1.7  0.7 - 4.0 K/uL Final   Monocytes Absolute 03/26/2021 0.8  0.1 - 1.0 K/uL Final   Eosinophils Absolute 03/26/2021 0.1  0.0 - 0.7 K/uL Final   Basophils Absolute 03/26/2021 0.0  0.0 - 0.1 K/uL Final   Interpretation 03/26/2021    Final   Comment: . Specific                        Level of Allergen IGE Class      kU/L             Specific IGE Antibody  -----         ---------        -------------------  0              <0.10           Absent/Undetectable   0/1        0.10-0.34           Very Low Level   1          0.35-0.69           Low Level   2          0.70-3.49           Moderate Level   3          3.50-17.4           High Level   4          17.5-49.9           Very High Level   5            50-100            Very High Level   6              >100            Very High Level . The clinical relevance of allergen results of 0.10-0.34 kU/L are undetermined and intended for  specialist use. . Allergens denoted with a "**" include results using one or more analyte specific reagents. In those cases, the test was developed and its analytical performance characteristics have been determined by [employer]. It has not been cleared or approved by the U.S. Food and Drug Administration. This assay  has been v                           alidated pursuant to the [employer]  and is used for clinical purposes.   Admission on 12/18/2020, Discharged on 12/18/2020  Component Date Value Ref Range Status   WBC 12/18/2020 5.1  4.0 - 10.5 K/uL Final   RBC 12/18/2020 5.41 (H)  3.87 - 5.11 MIL/uL Final   Hemoglobin 12/18/2020 15.9 (H)  12.0 - 15.0 g/dL Final   HCT 12/18/2020 47.4 (H)  36.0 - 46.0 % Final   MCV 12/18/2020 87.6  80.0 - 100.0 fL Final   MCH 12/18/2020 29.4  26.0 - 34.0 pg Final   MCHC 12/18/2020 33.5  30.0 - 36.0 g/dL Final   RDW 12/18/2020 13.5  11.5 - 15.5 % Final   Platelets 12/18/2020 180  150 - 400 K/uL Final   nRBC 12/18/2020 0.0  0.0 - 0.2 % Final   Neutrophils Relative % 12/18/2020 81  % Final   Neutro Abs 12/18/2020 4.1  1.7 - 7.7 K/uL Final   Lymphocytes Relative 12/18/2020 12  % Final   Lymphs Abs 12/18/2020 0.6 (L)  0.7 - 4.0 K/uL Final   Monocytes Relative 12/18/2020 7  % Final   Monocytes Absolute 12/18/2020 0.4  0.1 - 1.0 K/uL Final   Eosinophils Relative 12/18/2020 0  % Final   Eosinophils Absolute 12/18/2020 0.0  0.0 - 0.5 K/uL Final   Basophils Relative 12/18/2020 0  % Final   Basophils Absolute 12/18/2020 0.0  0.0 - 0.1 K/uL Final   Immature Granulocytes 12/18/2020 0  % Final   Abs Immature Granulocytes 12/18/2020 0.01  0.00 - 0.07 K/uL Final   Performed at [facility], [place] [address] [place], [place] [address]   Sodium 12/18/2020 138  135 -  145 mmol/L Final   Potassium 12/18/2020 4.5  3.5 - 5.1 mmol/L Final   Chloride 12/18/2020 110  98 - 111 mmol/L Final   CO2 12/18/2020 18 (L)  22 - 32 mmol/L Final   Glucose, Bld 12/18/2020 111 (H)  70 - 99 mg/dL Final   Glucose reference range applies only to samples taken after fasting for at least 8 hours.   BUN 12/18/2020 11  6 - 20 mg/dL Final   Creatinine, Ser 12/18/2020 0.91  0.44 - 1.00 mg/dL Final   Calcium 12/18/2020 9.7  8.9 - 10.3 mg/dL Final   Total Protein 12/18/2020 7.4  6.5 - 8.1 g/dL Final   Albumin 12/18/2020  4.4  3.5 - 5.0 g/dL Final   AST 12/18/2020 38  15 - 41 U/L Final   ALT 12/18/2020 25  0 - 44 U/L Final   Alkaline Phosphatase 12/18/2020 67  38 - 126 U/L Final   Total Bilirubin 12/18/2020 1.3 (H)  0.3 - 1.2 mg/dL Final   GFR, Estimated 12/18/2020 >60  >60 mL/min Final   Comment: (NOTE) Calculated using the CKD-EPI Creatinine Equation (2021)    Anion gap 12/18/2020 10  5 - 15 Final   Performed at [facility], [place] [address] [place], [place] [address]   Lipase 12/18/2020 26  11 - 51 U/L Final   Performed at [facility], [place] [address], [place], [place] [address]   Color, Urine 12/18/2020 YELLOW  YELLOW Final   APPearance 12/18/2020 HAZY (A)  CLEAR Final   Specific Gravity, Urine 12/18/2020 1.017  1.005 - 1.030 Final   pH 12/18/2020 8.0  5.0 - 8.0 Final   Glucose, UA 12/18/2020 NEGATIVE  NEGATIVE mg/dL Final   Hgb urine dipstick 12/18/2020 MODERATE (A)  NEGATIVE Final   Bilirubin Urine 12/18/2020 NEGATIVE  NEGATIVE Final   Ketones, ur 12/18/2020 20 (A)  NEGATIVE mg/dL Final   Protein, ur 12/18/2020 30 (A)  NEGATIVE mg/dL Final   Nitrite 12/18/2020 NEGATIVE  NEGATIVE Final   Leukocytes,Ua 12/18/2020 NEGATIVE  NEGATIVE Final   RBC / HPF 12/18/2020 0-5  0 - 5 RBC/hpf Final   WBC, UA 12/18/2020 0-5  0 - 5 WBC/hpf Final   Bacteria, UA 12/18/2020 RARE (A)  NONE SEEN Final   Squamous Epithelial / LPF 12/18/2020 6-10  0 - 5 Final   Mucus 12/18/2020 PRESENT   Final   Performed at [facility], [place] [address], [place], [place] [address]   I-stat hCG, quantitative 12/18/2020 <5.0  <5 mIU/mL Final   Comment 3 12/18/2020          Final   Comment:   GEST. AGE      CONC.  (mIU/mL)   <=1 WEEK        5 - 50     2 WEEKS       50 - 500     3 WEEKS       100 - 10,000     4 WEEKS     1,000 - 30,000        FEMALE AND NON-PREGNANT FEMALE:     LESS THAN 5 mIU/mL    Preg Test, Ur 12/18/2020 NEGATIVE  NEGATIVE Final   Comment:        THE  SENSITIVITY OF THIS METHODOLOGY IS >24 mIU/mL     Allergies: Pineapple, Latex, Other, and Raspberry  PTA Medications: (Not in a hospital admission)   Medical Decision Making  Patient recommended for  admission to [facility] observation unit for treatment and stabilization. She will be reassessed on 04/25/2021, disposition will be determined at that time.   Laboratory studies ordered including CBC, CMP, ethanol, A1c, hepatic function, lipid panel, magnesium and TSH.  Urine pregnancy and urine drug screen ordered.  EKG order initiated.  Current medications: -Acetaminophen 650 mg every 6 as needed/mild pain    Recommendations  Based on my evaluation the patient appears to have an emergency medical condition for which I recommend the patient be transferred to the emergency department for further evaluation. Patient reviewed with Dr Serafina Mitchell. She has been placed under involuntary commitment by this Probation officer. She has been accepted to [facility] Emergency department for medical clearance by Dr Reather Converse. She can [facility] to [facility] once medically cleared.   Lucky Rathke, FNP 04/24/21  2:36 PM
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
## 2021-04-24 NOTE — Progress Notes (Signed)
°   04/24/21 1436  [place] (Walk-ins at [facility] only)  How Did You Hear About [place]? Family/Friend  What Is the Reason for Your Visit/Call Today? 21 year old present to [facility] via [employer] after a friend called requesting a well-ness check after receiving a text from the patient stating, "I will always love you in the spirit and flesh." Patient reports currently feeling suicidal with no plan. Denied homicidal ideations and denied currently experiencing auditory/visual hallucinations but reports a history with symptoms 2 years ago. Patient report history of depression diagnosed at age 78, Bipolar Depression diagnosed at age 71, acute PTSD and anxiety. Reports history of experiencing manic symptoms (inability to sleep up for more than 24 hours and inability to eat). Has lost 50 pounds in the past year was 186 now weigh 125). Patient reports she drinks and smokes every weekend.  How Long Has This Been Causing You Problems? > than 6 months (report experiencing mental health since the age of 21 years old)  Have You Recently Had Any Thoughts About Hurting Yourself? Yes  How long ago did you have thoughts about hurting yourself? report currently suicidal ideations with no plan  Are You Planning to [place] At This time? No  Have you Recently Had Thoughts About [place]? No  Are You Planning To Harm Someone At This Time? No  Are you currently experiencing any auditory, visual or other hallucinations? No  Have You Used Any Alcohol or Drugs in the Past 24 Hours? Yes  How long ago did you use Drugs or Alcohol? Report last smoked THC 3 days ago and alcohol over the weekend  What Did You Use and How Much? over 10 drinks and unsure how much THC  Do you have any current medical co-morbidities that require immediate attention? Yes  Please describe current medical co-morbidities that require immediate attention: PTSD, Bipolar, Depression, anxiety  Clinician description of patient  physical appearance/behavior: patient dressed appropriately for the weather  What Do You Feel Would Help You the Most Today? Stress Management;Treatment for Depression or other mood problem  If access to [facility] was not available, would you have sought care in the Emergency Department? Yes  Determination of Need Emergent (2 hours)  Options For Referral Medication Management;Outpatient Therapy

## 2021-04-24 NOTE — ED Provider Notes (Signed)
[place] EMERGENCY DEPARTMENT Provider Note   CSN: [id number] Arrival date & time:        History  Chief Complaint  Patient presents with   Psychiatric Evaluation    Maureen Schultz is a 21 y.o. female.  Patient presents from behavioral health urgent care for medical assessment/medical clearance.  Patient presented to the urgent care voluntarily for resources.  Patient has a history of depression and was looking for therapist and outpatient resources.  Patient did not feel suicidal or homicidal.  Per behavioral health urgent care she had texted comments that were concerning for thinking of self-harm.  Patient sees a cardiologist at [facility] and she has been taking medications regularly aspirin outpatient follow-up.  Patient does not wish for inpatient treatment at this time.  Patient says she has support.  Patient says she is okay waiting for blood work results.  Patient has had weight loss unknown amount, history of H. pylori and decreased appetite.  No thyroid problems known.      Home Medications Prior to Admission medications   Medication Sig Start Date End Date Taking? Authorizing Provider  albuterol (PROVENTIL) (5 MG/ML) 0.5% nebulizer solution Take 2.5 mg by nebulization every 4 (four) hours as needed for shortness of breath.    [provider]  Atogepant (QULIPTA) 60 MG TABS Take 60 mg by mouth in the morning.    [provider]  medroxyPROGESTERone (DEPO-PROVERA) 150 MG/ML injection Inject 150 mg into the muscle every 3 (three) months.    [provider]      Allergies    Pineapple, Latex, Other, and Raspberry    Review of Systems   Review of Systems  Constitutional:  Positive for unexpected weight change. Negative for chills and fever.  HENT:  Negative for congestion.   Eyes:  Negative for visual disturbance.  Respiratory:  Negative for cough and shortness of breath.   Cardiovascular:  Negative for chest pain.   Gastrointestinal:  Negative for abdominal pain and vomiting.  Genitourinary:  Negative for dysuria and flank pain.  Musculoskeletal:  Negative for back pain, neck pain and neck stiffness.  Skin:  Negative for rash.  Neurological:  Negative for light-headedness and headaches.  Psychiatric/Behavioral:  Positive for dysphoric mood. Negative for suicidal ideas.    Physical Exam Updated Vital Signs BP 116/73    Pulse (!) 107    Temp 98 F (36.7 C) (Oral)    Resp 20    SpO2 96%  Physical Exam Vitals and nursing note reviewed.  Constitutional:      General: She is not in acute distress.    Appearance: She is well-developed.  HENT:     Head: Normocephalic and atraumatic.     Mouth/Throat:     Mouth: Mucous membranes are moist.  Eyes:     General:        Right eye: No discharge.        Left eye: No discharge.     Conjunctiva/sclera: Conjunctivae normal.  Neck:     Trachea: No tracheal deviation.  Cardiovascular:     Rate and Rhythm: Normal rate and regular rhythm.  Pulmonary:     Effort: Pulmonary effort is normal.     Breath sounds: Normal breath sounds.  Abdominal:     General: There is no distension.     Palpations: Abdomen is soft.     Tenderness: There is no abdominal tenderness. There is no guarding.  Musculoskeletal:     Cervical back:  Normal range of motion and neck supple. No rigidity.  Skin:    General: Skin is warm.     Capillary Refill: Capillary refill takes less than 2 seconds.  Neurological:     General: No focal deficit present.     Mental Status: She is alert.     Cranial Nerves: No cranial nerve deficit.  Psychiatric:        Speech: Speech is not rapid and pressured.     Comments: Patient clinically has depression.  Patient does make good eye contact, no suicidal or homicidal ideation or concerns at this time.  Patient cooperative answers questions appropriately.  Clinically patient has no signs of mania.  No hallucinations.  Patient is frustrated that she  was sent over for further evaluation.    ED Results / Procedures / Treatments   Labs (all labs ordered are listed, but only abnormal results are displayed) Labs Reviewed  SALICYLATE LEVEL - Abnormal; Notable for the following components:      Result Value   Salicylate Lvl [id number] (*)    All other components within normal limits  ACETAMINOPHEN LEVEL - Abnormal; Notable for the following components:   Acetaminophen (Tylenol), Serum <10 (*)    All other components within normal limits  ETHANOL  CBC WITH DIFFERENTIAL/PLATELET    EKG None  Radiology No results found.  Procedures Procedures    Medications Ordered in ED Medications - No data to display  ED Course/ Medical Decision Making/ A&P                           Medical Decision Making Amount and/or Complexity of Data Reviewed Labs: ordered.   Received a call from behavioral urgent care as they were unable to obtain blood work on a patient with history of pulmonary atresia and follows with [facility].  Patient has shortness of breath at times however is being followed for this and normally does not need oxygen.  Oxygen saturations normally around 85 to upper 80s.  Patient denies fever or infectious symptoms.  Patient admits to depression symptoms however primarily would like outpatient resources for more support.  Patient feels safe going home, has puppies to attend to.  Patient is comfortable and requesting discharge after medical clearance/blood work.  Patient understands she can return at any time, has capacity decisions.  We will have to see if behavioral health filled out IVC or not.  Reviewed medical records and urgent care note discussing details which also comments on text message she sent.  Patient says this is someone she has not talked to in a long time. Patient observed, on reassessment well-appearing no signs of self-harm and patient feels safe to go home.  IVC paperwork rescinded.  Blood work reviewed no  significant abnormalities, no signs of significant anemia, thyroid and drug levels negative.  Patient medically clear and stable for outpatient follow-up.        Final Clinical Impression(s) / ED Diagnoses Final diagnoses:  Current moderate episode of major depressive disorder, unspecified whether recurrent [facility])    Rx / DC Orders ED Discharge Orders     None         Elnora Morrison, MD 04/24/21 2228

## 2021-04-24 NOTE — ED Notes (Signed)
Ellyn Hack, RN/Charge at [facility] for report. Non-emergent EMS called for transport.

## 2021-04-24 NOTE — ED Notes (Signed)
Pt has requested to use the phone multiple times.  Per Doran Heater pt is ok to be brought into flex and allowed to use the phone there while she waits for [employer] to serve IVC papers.  Nyiah MHT is assisting pt with phone calls

## 2021-04-24 NOTE — Discharge Instructions (Signed)
Call behavioral at the urgent care to clarify outpatient follow-up resources. Return for persistent or worsening thoughts of self-harm or other concerns.

## 2021-04-24 NOTE — ED Notes (Signed)
Rn attempted blood draw, unable to attain. Phlebotomy said they would come and draw.

## 2021-05-01 ENCOUNTER — Ambulatory Visit: Payer: Commercial Managed Care - PPO | Admitting: Adult Health

## 2021-06-22 ENCOUNTER — Ambulatory Visit: Payer: Commercial Managed Care - PPO | Admitting: Cardiology

## 2022-10-04 IMAGING — DX DG CHEST 2V
2 series · 2 of 2 positions shown · non-contrast
Comparison: None.

CLINICAL DATA: History of asthma. Pulmonary atresia. History of
influenza. Shortness of breath.

EXAM:
CHEST - 2 VIEW

[chest pa]
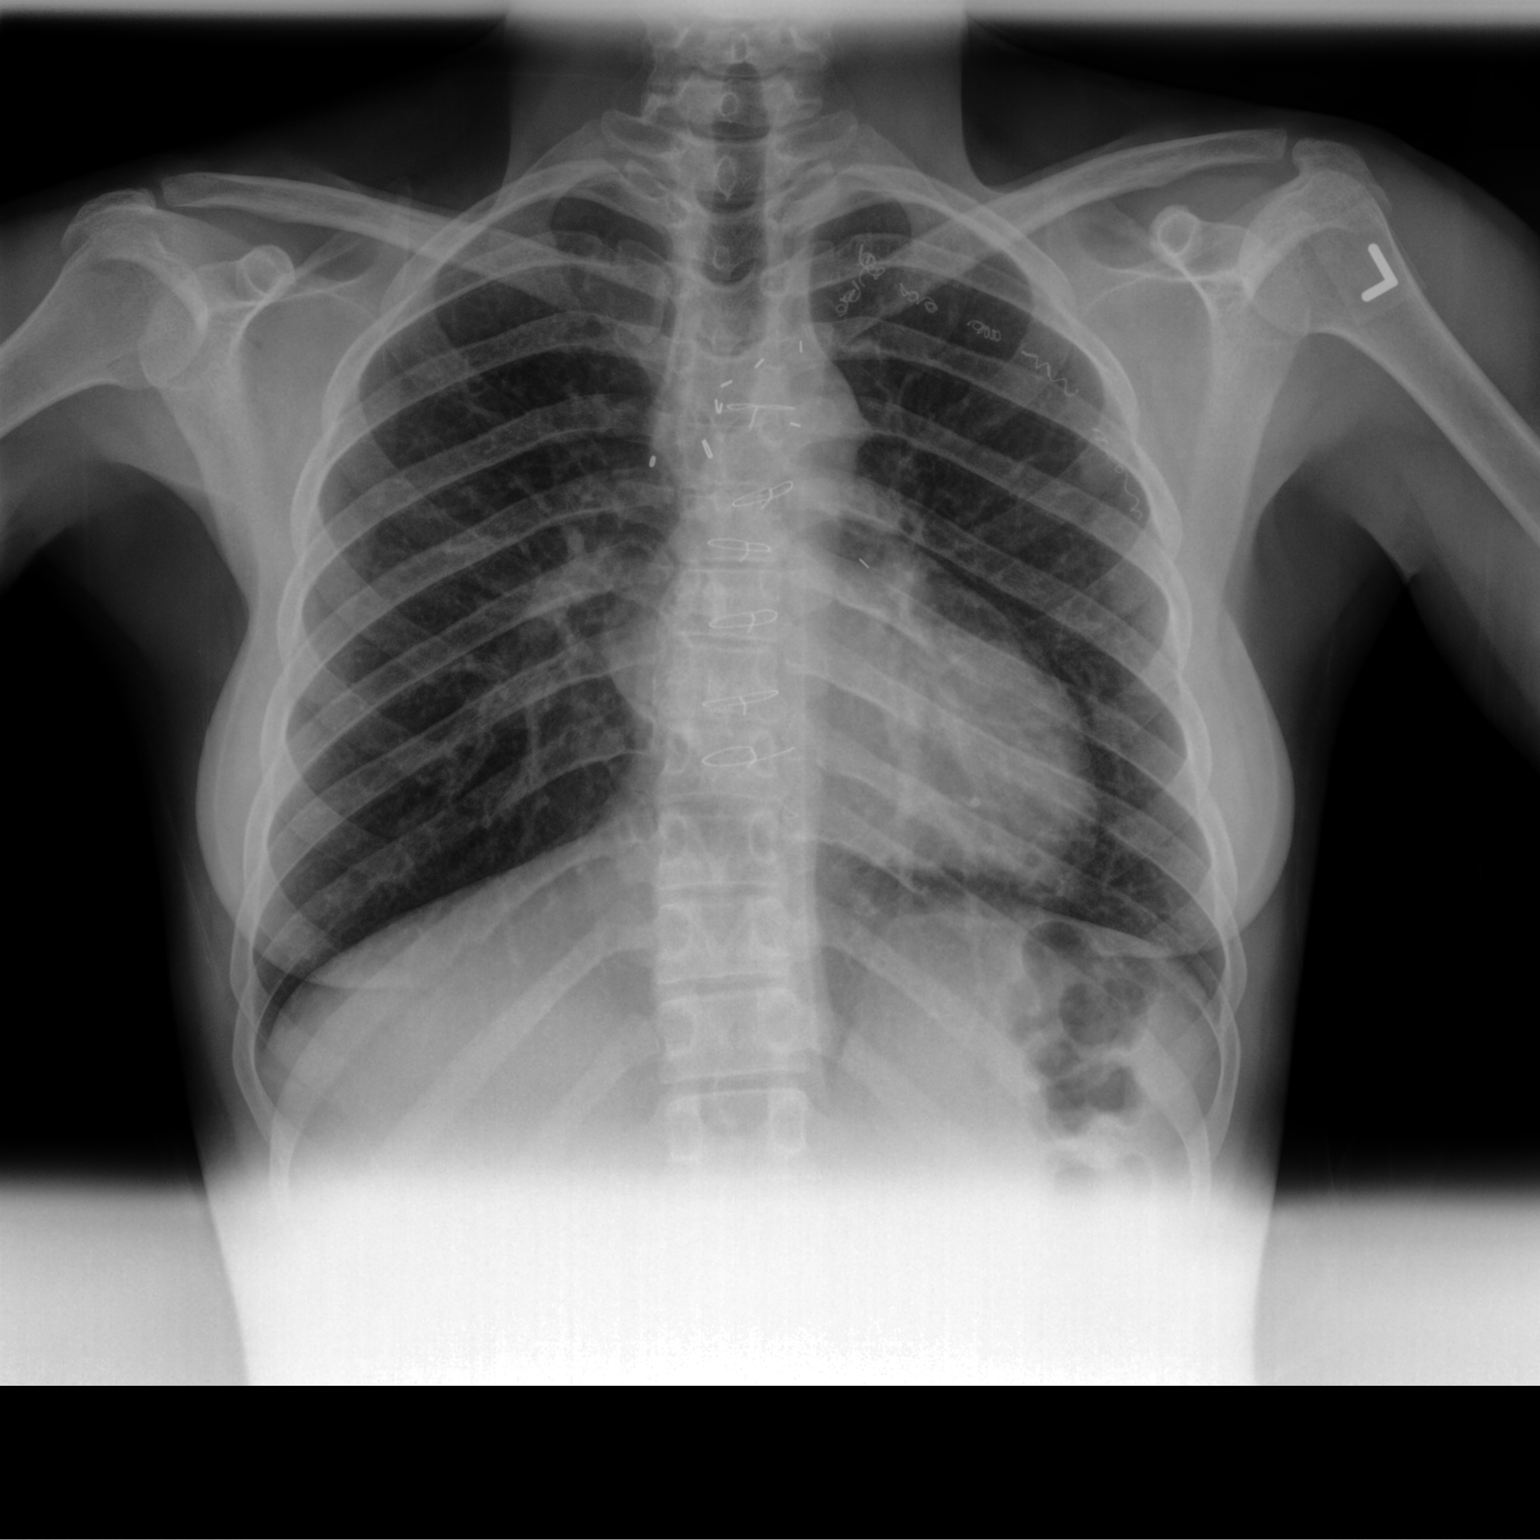

[chest lat]
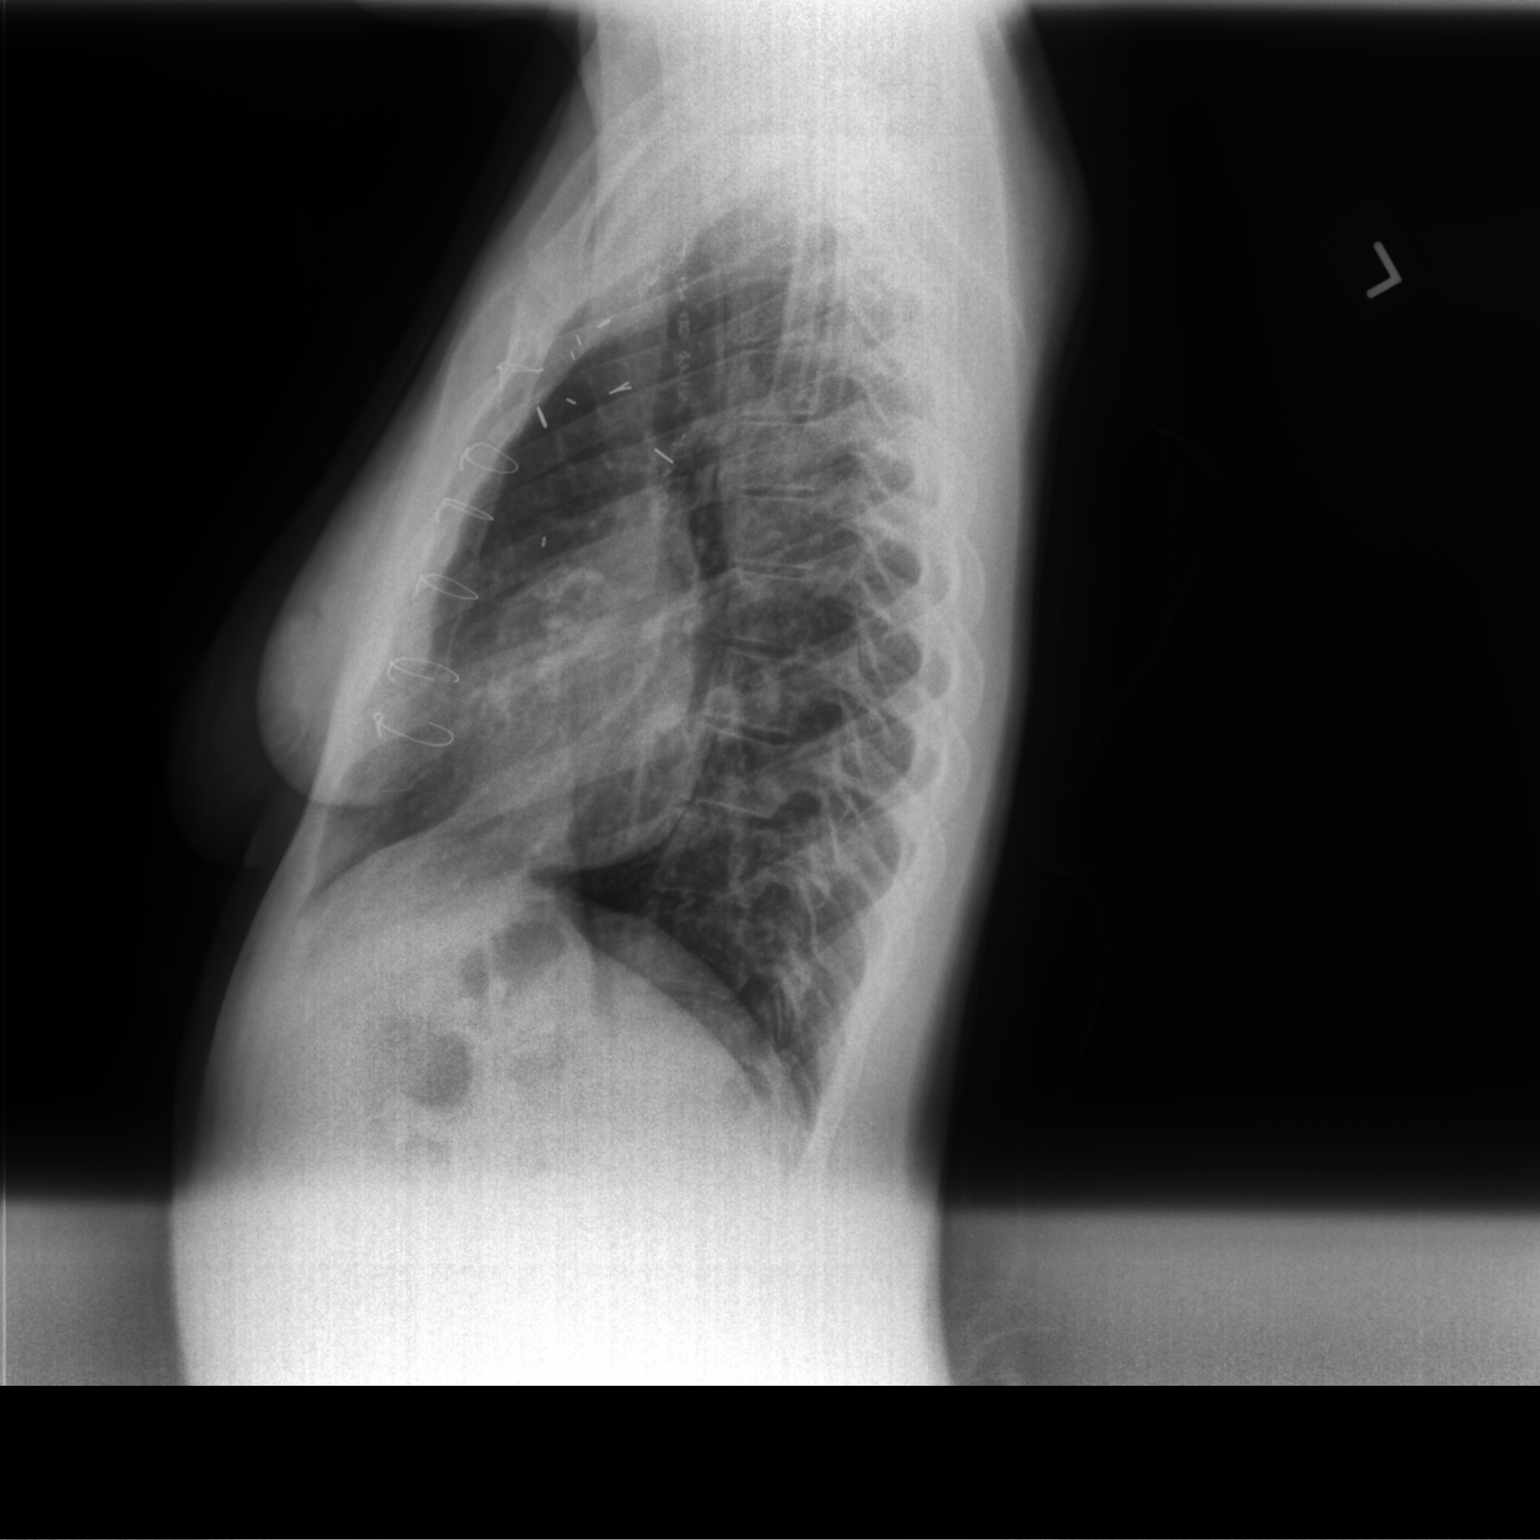

[2 of 2 positions shown; findings below may reference images not displayed]

FINDINGS: Patient is post remote median sternotomy. Surgical clips in the
mediastinum. Multiple coils project in the left upper lung zone. The
heart is normal in size. Normal left cardiac apex. Aortic arch
appears on the left. There is no mediastinal widening. Normal
pulmonary vasculature. No focal airspace disease, pleural effusion,
or pneumothorax. No acute osseous abnormalities are seen.
IMPRESSION: 1. No acute chest findings.
2. Prior sternotomy with surgical clips in the mediastinum and
multiple presumed vascular coils in the left upper hemithorax.
